# Patient Record
Sex: Male | Born: 1975 | Race: Black or African American | Hispanic: No | Marital: Single | Smoking: Current every day smoker
Health system: Southern US, Community
[De-identification: ages and names within clinical notes are randomized; demographics above are authoritative.]

## PROBLEM LIST (undated history)

## (undated) DIAGNOSIS — F259 Schizoaffective disorder, unspecified: Secondary | ICD-10-CM

## (undated) DIAGNOSIS — F250 Schizoaffective disorder, bipolar type: Secondary | ICD-10-CM

## (undated) HISTORY — PX: SMALL INTESTINE SURGERY: SHX150

---

## 2004-09-25 ENCOUNTER — Emergency Department: Payer: Self-pay | Admitting: Emergency Medicine

## 2005-07-08 ENCOUNTER — Emergency Department: Payer: Self-pay | Admitting: Emergency Medicine

## 2005-08-14 ENCOUNTER — Emergency Department: Payer: Self-pay | Admitting: Emergency Medicine

## 2005-08-25 ENCOUNTER — Emergency Department: Payer: Self-pay | Admitting: General Practice

## 2005-09-01 ENCOUNTER — Emergency Department: Payer: Self-pay | Admitting: Unknown Physician Specialty

## 2007-10-13 ENCOUNTER — Emergency Department: Payer: Self-pay | Admitting: Emergency Medicine

## 2007-12-12 ENCOUNTER — Emergency Department: Payer: Self-pay | Admitting: Emergency Medicine

## 2009-08-25 ENCOUNTER — Emergency Department: Payer: Self-pay | Admitting: Emergency Medicine

## 2009-11-14 ENCOUNTER — Emergency Department: Payer: Self-pay | Admitting: Emergency Medicine

## 2010-05-29 ENCOUNTER — Emergency Department: Payer: Self-pay | Admitting: Unknown Physician Specialty

## 2011-10-14 ENCOUNTER — Inpatient Hospital Stay: Payer: Self-pay | Admitting: Surgery

## 2011-10-14 LAB — COMPREHENSIVE METABOLIC PANEL WITH GFR
Albumin: 4.2 g/dL
Alkaline Phosphatase: 118 U/L
Anion Gap: 11
BUN: 10 mg/dL
Bilirubin,Total: 0.8 mg/dL
Calcium, Total: 8.9 mg/dL
Chloride: 104 mmol/L
Co2: 24 mmol/L
Creatinine: 1.28 mg/dL
EGFR (African American): >60
EGFR (Non-African Amer.): >60
Glucose: 84 mg/dL
Osmolality: 276
Potassium: 4 mmol/L
SGOT(AST): 43 U/L — ABNORMAL HIGH
SGPT (ALT): 39 U/L
Sodium: 139 mmol/L
Total Protein: 8.2 g/dL

## 2011-10-14 LAB — URINALYSIS, COMPLETE
Bacteria: NONE SEEN
Bilirubin,UR: NEGATIVE
Glucose,UR: NEGATIVE mg/dL
Leukocyte Esterase: NEGATIVE
Nitrite: NEGATIVE
Ph: 5
Protein: NEGATIVE
RBC,UR: 2 /HPF
Specific Gravity: 1.016
Squamous Epithelial: <1
WBC UR: <1 /HPF

## 2011-10-14 LAB — APTT: Activated PTT: 28.3 s

## 2011-10-14 LAB — DRUG SCREEN, URINE
Amphetamines, Ur Screen: NEGATIVE
Barbiturates, Ur Screen: NEGATIVE
Benzodiazepine, Ur Scrn: NEGATIVE
Cannabinoid 50 Ng, Ur ~~LOC~~: POSITIVE
Cocaine Metabolite,Ur ~~LOC~~: NEGATIVE
MDMA (Ecstasy)Ur Screen: NEGATIVE
Methadone, Ur Screen: NEGATIVE
Opiate, Ur Screen: NEGATIVE
Phencyclidine (PCP) Ur S: NEGATIVE
Tricyclic, Ur Screen: NEGATIVE

## 2011-10-14 LAB — PROTIME-INR
INR: 1
Prothrombin Time: 13.5 s

## 2011-10-14 LAB — CBC
HCT: 48.7 %
HGB: 16.6 g/dL
MCH: 34.1 pg — ABNORMAL HIGH
MCHC: 34 g/dL
MCV: 100 fL
Platelet: 213 x10 3/mm 3
RBC: 4.86 x10 6/mm 3
RDW: 15.8 % — ABNORMAL HIGH
WBC: 15.8 x10 3/mm 3 — ABNORMAL HIGH

## 2011-10-14 LAB — MAGNESIUM: Magnesium: 1.7 mg/dL — ABNORMAL LOW

## 2011-10-15 LAB — CBC WITH DIFFERENTIAL/PLATELET
Basophil %: 0.3 %
Eosinophil #: 0 10*3/uL (ref 0.0–0.7)
HCT: 40.7 % (ref 40.0–52.0)
HGB: 13.8 g/dL (ref 13.0–18.0)
Lymphocyte #: 1.4 10*3/uL (ref 1.0–3.6)
MCH: 34.3 pg — ABNORMAL HIGH (ref 26.0–34.0)
MCHC: 34 g/dL (ref 32.0–36.0)
Monocyte #: 0.8 x10 3/mm (ref 0.2–1.0)
Neutrophil #: 5.5 10*3/uL (ref 1.4–6.5)
RBC: 4.04 10*6/uL — ABNORMAL LOW (ref 4.40–5.90)

## 2011-10-15 LAB — COMPREHENSIVE METABOLIC PANEL
Albumin: 2.8 g/dL — ABNORMAL LOW (ref 3.4–5.0)
Alkaline Phosphatase: 91 U/L (ref 50–136)
Anion Gap: 6 — ABNORMAL LOW (ref 7–16)
BUN: 6 mg/dL — ABNORMAL LOW (ref 7–18)
Bilirubin,Total: 1 mg/dL (ref 0.2–1.0)
Chloride: 105 mmol/L (ref 98–107)
Creatinine: 1.04 mg/dL (ref 0.60–1.30)
EGFR (Non-African Amer.): >60
Glucose: 115 mg/dL — ABNORMAL HIGH (ref 65–99)
Osmolality: 274 (ref 275–301)
Potassium: 3.5 mmol/L (ref 3.5–5.1)
SGOT(AST): 36 U/L (ref 15–37)

## 2011-10-22 ENCOUNTER — Emergency Department: Payer: Self-pay | Admitting: Emergency Medicine

## 2011-10-22 LAB — URINALYSIS, COMPLETE
Bacteria: NONE SEEN
Bilirubin,UR: NEGATIVE
Blood: NEGATIVE
Ketone: NEGATIVE
Nitrite: NEGATIVE
Ph: 7 (ref 4.5–8.0)
Squamous Epithelial: <1

## 2011-10-22 LAB — LIPASE, BLOOD: Lipase: 117 U/L (ref 73–393)

## 2011-10-22 LAB — COMPREHENSIVE METABOLIC PANEL
Alkaline Phosphatase: 115 U/L (ref 50–136)
BUN: 8 mg/dL (ref 7–18)
Bilirubin,Total: 0.4 mg/dL (ref 0.2–1.0)
Creatinine: 1.09 mg/dL (ref 0.60–1.30)
EGFR (African American): >60
EGFR (Non-African Amer.): >60
Glucose: 97 mg/dL (ref 65–99)
SGOT(AST): 29 U/L (ref 15–37)
SGPT (ALT): 40 U/L

## 2011-10-22 LAB — CBC
HCT: 49.3 % (ref 40.0–52.0)
MCH: 33.5 pg (ref 26.0–34.0)
MCV: 102 fL — ABNORMAL HIGH (ref 80–100)
RBC: 4.83 10*6/uL (ref 4.40–5.90)
WBC: 7.8 10*3/uL (ref 3.8–10.6)

## 2011-10-22 LAB — PROTIME-INR: INR: 0.9

## 2012-05-04 ENCOUNTER — Emergency Department: Payer: Self-pay | Admitting: Emergency Medicine

## 2013-03-14 LAB — CBC
HCT: 49.3 % (ref 40.0–52.0)
HGB: 16.7 g/dL (ref 13.0–18.0)
MCH: 33.6 pg (ref 26.0–34.0)
MCV: 100 fL (ref 80–100)
Platelet: 211 10*3/uL (ref 150–440)
RBC: 4.96 10*6/uL (ref 4.40–5.90)
WBC: 7 10*3/uL (ref 3.8–10.6)

## 2013-03-14 LAB — COMPREHENSIVE METABOLIC PANEL
Anion Gap: 5 — ABNORMAL LOW (ref 7–16)
BUN: 5 mg/dL — ABNORMAL LOW (ref 7–18)
Bilirubin,Total: 0.5 mg/dL (ref 0.2–1.0)
Chloride: 103 mmol/L (ref 98–107)
EGFR (African American): >60
Glucose: 102 mg/dL — ABNORMAL HIGH (ref 65–99)
Osmolality: 275 (ref 275–301)
SGPT (ALT): 63 U/L (ref 12–78)
Sodium: 139 mmol/L (ref 136–145)
Total Protein: 7.5 g/dL (ref 6.4–8.2)

## 2013-03-14 LAB — URINALYSIS, COMPLETE
Bilirubin,UR: NEGATIVE
Glucose,UR: NEGATIVE mg/dL (ref 0–75)
Leukocyte Esterase: NEGATIVE
Nitrite: NEGATIVE
Ph: 6 (ref 4.5–8.0)
Protein: NEGATIVE
Specific Gravity: 1.005 (ref 1.003–1.030)
Squamous Epithelial: NONE SEEN

## 2013-03-14 LAB — DRUG SCREEN, URINE
Cannabinoid 50 Ng, Ur ~~LOC~~: POSITIVE (ref ?–50)
Cocaine Metabolite,Ur ~~LOC~~: NEGATIVE (ref ?–300)
MDMA (Ecstasy)Ur Screen: NEGATIVE (ref ?–500)
Opiate, Ur Screen: NEGATIVE (ref ?–300)
Tricyclic, Ur Screen: NEGATIVE (ref ?–1000)

## 2013-03-14 LAB — ETHANOL: Ethanol: 119 mg/dL

## 2013-03-16 ENCOUNTER — Inpatient Hospital Stay: Payer: Self-pay | Admitting: Psychiatry

## 2013-03-18 LAB — VALPROIC ACID LEVEL: Valproic Acid: 81 ug/mL

## 2013-11-04 ENCOUNTER — Emergency Department: Payer: Self-pay | Admitting: Emergency Medicine

## 2014-07-08 ENCOUNTER — Emergency Department
Admit: 2014-07-08 | Disposition: A | Discharge status: Other Acute Inpatient Hospital | Payer: Self-pay | Admitting: Student

## 2014-07-08 LAB — CBC
HCT: 50.2 % (ref 40.0–52.0)
HGB: 17.6 g/dL (ref 13.0–18.0)
MCH: 34.3 pg — ABNORMAL HIGH (ref 26.0–34.0)
MCHC: 35.1 g/dL (ref 32.0–36.0)
MCV: 98 fL (ref 80–100)
Platelet: 204 10*3/uL (ref 150–440)
RBC: 5.13 10*6/uL (ref 4.40–5.90)
RDW: 13.7 % (ref 11.5–14.5)
WBC: 5.8 10*3/uL (ref 3.8–10.6)

## 2014-07-08 LAB — COMPREHENSIVE METABOLIC PANEL
ALK PHOS: 105 U/L
Albumin: 4.4 g/dL
Anion Gap: 11 (ref 7–16)
BILIRUBIN TOTAL: 0.6 mg/dL
BUN: 5 mg/dL — ABNORMAL LOW
CHLORIDE: 100 mmol/L — AB
CREATININE: 1 mg/dL
Calcium, Total: 8.9 mg/dL
Co2: 25 mmol/L
EGFR (Non-African Amer.): >60
GLUCOSE: 108 mg/dL — AB
POTASSIUM: 3.4 mmol/L — AB
SGOT(AST): 79 U/L — ABNORMAL HIGH
SGPT (ALT): 40 U/L
Sodium: 136 mmol/L
Total Protein: 7.9 g/dL

## 2014-07-08 LAB — ETHANOL: Ethanol: 185 mg/dL

## 2014-07-08 LAB — DRUG SCREEN, URINE
Amphetamines, Ur Screen: NEGATIVE
BARBITURATES, UR SCREEN: NEGATIVE
Benzodiazepine, Ur Scrn: NEGATIVE
COCAINE METABOLITE, UR ~~LOC~~: NEGATIVE
Cannabinoid 50 Ng, Ur ~~LOC~~: NEGATIVE
MDMA (Ecstasy)Ur Screen: NEGATIVE
Methadone, Ur Screen: NEGATIVE
OPIATE, UR SCREEN: NEGATIVE
Phencyclidine (PCP) Ur S: NEGATIVE
Tricyclic, Ur Screen: NEGATIVE

## 2014-07-08 LAB — TSH: THYROID STIMULATING HORM: 0.721 u[IU]/mL

## 2014-07-08 LAB — URINALYSIS, COMPLETE
BILIRUBIN, UR: NEGATIVE
Bacteria: NONE SEEN
Glucose,UR: NEGATIVE mg/dL (ref 0–75)
Ketone: NEGATIVE
LEUKOCYTE ESTERASE: NEGATIVE
Nitrite: NEGATIVE
PH: 6 (ref 4.5–8.0)
PROTEIN: NEGATIVE
RBC,UR: NONE SEEN /HPF (ref 0–5)
Specific Gravity: 1.001 (ref 1.003–1.030)
Squamous Epithelial: <1
WBC UR: NONE SEEN /HPF (ref 0–5)

## 2014-07-08 LAB — ACETAMINOPHEN LEVEL

## 2014-07-08 LAB — SALICYLATE LEVEL: Salicylates, Serum: <4 mg/dL

## 2014-07-22 LAB — COMPREHENSIVE METABOLIC PANEL
ALT: 75 U/L — AB
ANION GAP: 10 (ref 7–16)
Albumin: 4.5 g/dL
Alkaline Phosphatase: 93 U/L
BUN: 12 mg/dL
Bilirubin,Total: 0.7 mg/dL
CHLORIDE: 98 mmol/L — AB
CREATININE: 1.18 mg/dL
Calcium, Total: 8.8 mg/dL — ABNORMAL LOW
Co2: 27 mmol/L
EGFR (African American): >60
EGFR (Non-African Amer.): >60
Glucose: 111 mg/dL — ABNORMAL HIGH
Potassium: 3.7 mmol/L
SGOT(AST): 57 U/L — ABNORMAL HIGH
Sodium: 135 mmol/L
TOTAL PROTEIN: 7.7 g/dL

## 2014-07-22 LAB — URINALYSIS, COMPLETE
BACTERIA: NONE SEEN
BILIRUBIN, UR: NEGATIVE
GLUCOSE, UR: NEGATIVE mg/dL (ref 0–75)
KETONE: NEGATIVE
LEUKOCYTE ESTERASE: NEGATIVE
Nitrite: NEGATIVE
Ph: 6 (ref 4.5–8.0)
Protein: NEGATIVE
SQUAMOUS EPITHELIAL: NONE SEEN
Specific Gravity: 1.003 (ref 1.003–1.030)
WBC UR: NONE SEEN /HPF (ref 0–5)

## 2014-07-22 LAB — ETHANOL: Ethanol: 157 mg/dL

## 2014-07-22 LAB — DRUG SCREEN, URINE
AMPHETAMINES, UR SCREEN: NEGATIVE
BENZODIAZEPINE, UR SCRN: NEGATIVE
Barbiturates, Ur Screen: NEGATIVE
CANNABINOID 50 NG, UR ~~LOC~~: NEGATIVE
COCAINE METABOLITE, UR ~~LOC~~: NEGATIVE
MDMA (Ecstasy)Ur Screen: NEGATIVE
Methadone, Ur Screen: NEGATIVE
Opiate, Ur Screen: NEGATIVE
Phencyclidine (PCP) Ur S: NEGATIVE
TRICYCLIC, UR SCREEN: NEGATIVE

## 2014-07-22 LAB — CBC
HCT: 51.1 % (ref 40.0–52.0)
HGB: 17.3 g/dL (ref 13.0–18.0)
MCH: 32.7 pg (ref 26.0–34.0)
MCHC: 33.8 g/dL (ref 32.0–36.0)
MCV: 97 fL (ref 80–100)
Platelet: 242 10*3/uL (ref 150–440)
RBC: 5.29 10*6/uL (ref 4.40–5.90)
RDW: 13.6 % (ref 11.5–14.5)
WBC: 8 10*3/uL (ref 3.8–10.6)

## 2014-07-22 LAB — SALICYLATE LEVEL: Salicylates, Serum: <4 mg/dL

## 2014-07-22 LAB — ACETAMINOPHEN LEVEL: Acetaminophen: <10 ug/mL

## 2014-07-24 ENCOUNTER — Inpatient Hospital Stay
Admit: 2014-07-24 | Disposition: A | Discharge status: Home / Self Care | Payer: Self-pay | Attending: Psychiatry | Admitting: Psychiatry

## 2014-07-25 NOTE — H&P (Signed)
PATIENT NAME:  Antonio Mccoy, Antonio Mccoy MR#:  [id number] DATE OF BIRTH:  02-01-1976  DATE OF ADMISSION:  03/16/2013  REFERRING PHYSICIAN: Emergency Room M.D.   ATTENDING PHYSICIAN: Muriel Hannold B. Jennet Maduro, M.D.   IDENTIFYING DATA: Antonio Mccoy is a 39 year old male with history of psychosis.   CHIEF COMPLAINT: "I want to go home."   HISTORY OF PRESENT ILLNESS: Antonio Mccoy was brought to the Emergency Room by his sister who has been taking care of him most of his life. He started behaving erratically lately, knocking on neighbors doors and arguing with his neighbors. He also has not been able to sleep lately. The patient lives with his girlfriend and her 2 children, one of which is a baby. They are not his biological children he states, but he takes good care of them. The patient apparently has not been seeing a psychiatrist lately and has been off medications. He is unable to provide any details of his story and feels that he could go home right away, but does agree to take medications in the hospital including injectable antipsychotic in order to get out of here. His behavior in the Emergency Room has been good and he accepted the first dose of [place]. The patient has a history of mental illness and in the past did well on a combination of Depakote and Haldol Decanoate shots. We know that he also had been treated with Risperdal Consta. The patient has a long history of mental illness. When  psychotic, he used to get in trouble a lot and has multiple arrests. Review of his pharmacy records, discloses that the patient was treated for the last time in August 2012. It is unclear if he has seen anybody since.   PAST PSYCHIATRIC HISTORY: The patient has multiple psychiatric hospitalizations including state hospital admissions.  Apparently the last time when brought to the Emergency Room, he was sent to [facility]. He has been tried on multiple medications. He does well on injectable as compliance  has always been problematic. There were no suicide attempts according to the patient and his sister.   FAMILY PSYCHIATRIC HISTORY: None reported.   PAST MEDICAL HISTORY: Constipation.   ALLERGIES: No known drug allergies.   MEDICATIONS ON ADMISSION: None.   SOCIAL HISTORY: As above. He lives with his girlfriend. Apparently most of his life, he has had a girlfriend and oftentimes has been supported by women.  He is disabled from mental illness and receives Medicare. I am not sure about his Medicaid status.  He has a sister, Venia Minks, phone number [phone] who has been very supportive and making sure that the patient receives proper care.    REVIEW OF SYSTEMS:  CONSTITUTIONAL: No fevers or chills. No weight changes.  EYES: No double or blurred vision.  ENT: No hearing loss.  RESPIRATORY: No shortness of breath or cough.  CARDIOVASCULAR: No chest pain or orthopnea.  GASTROINTESTINAL: No abdominal pain, nausea, vomiting, or diarrhea.  GENITOURINARY: No incontinence or frequency.  ENDOCRINE: No heat or cold intolerance.  LYMPHATIC: No anemia or easy bruising.  INTEGUMENTARY: No acne or rash.  MUSCULOSKELETAL: No muscle or joint pain.  NEUROLOGIC: No tingling or weakness.  PSYCHIATRIC: See history of present illness for details.   PHYSICAL EXAMINATION: VITAL SIGNS: Blood pressure 134/77, pulse 96, respirations 20, temperature 99.  GENERAL: This is a well-developed male in no acute distress.  HEENT: The pupils are equal, round, and reactive to light. Sclerae are anicteric.  NECK: Supple. No thyromegaly.  LUNGS: Clear to auscultation. No dullness to percussion.  HEART: Regular rhythm and rate. No murmurs, rubs, or gallops.  ABDOMEN: Soft, nontender, nondistended. Positive bowel sounds.  MUSCULOSKELETAL: Normal muscle strength in all extremities.  SKIN: No rashes or bruises.  LYMPHATIC: No cervical adenopathy.  NEUROLOGIC: Cranial nerves II through XII are intact.    LABORATORY DATA: Chemistries within normal limits except for potassium of 3.2. Blood alcohol level is 0.119. LFTs within normal limits except for alkaline phosphatase of 121 and AST of 77. Urine tox screen is positive for cannabinoids. CBC within normal limits. Urinalysis is not suggestive of urinary tract infection. Serum acetaminophen less than 2, serum salicylates 4.7.   MENTAL STATUS EXAMINATION ON ADMISSION: The patient is alert and oriented to person, place, and somewhat to situation. He has been sleeping in the Emergency Room in the morning following injection of the Haldol and Geodon last night when he was slightly agitated. He was unhappy and felt that he was being tricked into coming to the Emergency Room and was mad when he learned that he is on involuntary inpatient psychiatric commitment. At present, his behavior is exemplary and he is compliant with treatment and agrees with the plan. He is well groomed. He wears hospital scrubs. He maintains good eye contact. There is poverty of speech. His mood is fine with flat affect. Thought process is slow. Thought content: He denies thoughts of hurting himself or others. He seems a little paranoid. He denies auditory or visual hallucinations. His cognition is difficult to assess. He is probably disorganized at president. His insight and judgment are poor.   SUICIDE RISK ASSESSMENT ON ADMISSION: This is a patient with history of psychosis who was brought to the hospital with psychotic disorganization in the context of substance use and treatment noncompliance.   DIAGNOSES: AXIS I: Schizophrenia, alcohol abuse, marijuana abuse.  AXIS II: Deferred.  AXIS III: Deferred.  AXIS IV: Mental illness, substance abuse, treatment compliance.  AXIS V: Global assessment of functioning 35.   PLAN: The patient was admitted to [facility] Medicine unit for safety, stabilization and medication management. He was initially placed on  suicide precautions and was closely monitored for any unsafe behaviors. He underwent full psychiatric and risk assessment. He received pharmacotherapy, individual and group psychotherapy, substance abuse counseling, and support from therapeutic milieu.  1.  Psychosis. The patient was started on Invega Sustenna in the Emergency Room and received his first injection of 234 mg. Next injection on the 16th. He was also started on Depakote 500 mg 3 times daily as he responded to Depakote well in the past. It is quite possible that he has schizoaffective disorder or maybe even bipolar since he has been away from the hospital for the past 2 years, while off medications.  2.  Substance abuse: The patient minimizes his problems and is not interested in substance abuse treatment at this point.  3.  Disposition. He will likely return to home with his girlfriend.    ____________________________ Ellin GoodieJolanta B. Jennet MaduroPucilowska, MD jbp:dp D: 03/16/2013 15:36:34 ET T: 03/16/2013 16:29:04 ET JOB#: [id number]  cc: Finlay Godbee B. Jennet MaduroPucilowska, MD, <Dictator> Shari ProwsJOLANTA B Agnes Probert MD ELECTRONICALLY SIGNED 04/02/2013 4:33
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
## 2014-07-28 LAB — VALPROIC ACID LEVEL: VALPROIC ACID: 50 ug/mL (ref 50–100)
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
## 2014-08-03 NOTE — Consult Note (Addendum)
PATIENT NAME:  [name], Idrissa L MR#:  [id number] DATE OF BIRTH:  [birth year]  DATE OF CONSULTATION:  07/22/2014  REFERRING PHYSICIAN:   CONSULTING PHYSICIAN:  Audery AmelJohn T. Clapacs, MD  IDENTIFYING INFORMATION AND REASON FOR CONSULT: This is a 39 year old man with a history of schizophrenia, who is brought into the hospital under commitment filed by his sister that describes disorganized, psychotic behavior.   CHIEF COMPLAINT: "There is nothing wrong with me."   HISTORY OF PRESENT ILLNESS: Information from the patient and the chart. Commitment paperwork from his sister says that ever since coming home from [facility] a week and a half ago the patient has continued to be psychotic. She describes him standing out in the middle of the road making traffic drive around him. Feels like he is not taking any medication and is continuing to drink. When he first came into the Emergency Room today the patient was very agitated and unable to give any history. He was given multiple doses of medication and has slept for a while. On interview today he knows he is in the hospital, but says there is no reason for him to be here. He claims he has been taking his medication and that everything is under control. Denies any hallucinations. Does not think any of his behavior has been a problem.   PAST PSYCHIATRIC HISTORY: Long history of schizophrenia and multiple hospitalizations. No known history of suicide attempts. Gets quite agitated at times when he is psychotic. Has responded well in the past to Haldol and has been on Haldol decanoate as well as Depakote.   PAST MEDICAL HISTORY: No known significant ongoing medical problems.   SUBSTANCE ABUSE HISTORY: The patient drinks regularly which he admits to, but will not be specific about how much. Denies that he abuses other drugs.   FAMILY HISTORY: No known family history.   SOCIAL HISTORY: The patient lives with some extended family in the community. Not  working. It sounds like he often is a nuisance when he is psychotic.   REVIEW OF SYSTEMS: The patient denies any pain. Does say he feels a little tired. Denies hallucinations. Denies suicidal or homicidal ideation.   MENTAL STATUS EXAMINATION: Somewhat disheveled gentleman who looks his stated age. Not very cooperative with the interview. Eye contact poor. Psychomotor activity slow and sluggish. Speech is decreased in total amount, quiet, and at times hard to understand. Affect flat. Mood stated as fine. Thoughts are disorganized, scattered, paranoid in content. Denies auditory or visual hallucinations. Denies suicidal or homicidal ideation. Repeats 3 words immediately, remembers 1 of them at 3 minutes. Judgment and insight impaired.   LABORATORY RESULTS: Alcohol level on presentation last night 157. AST elevated at 57, ALT elevated at 75. Calcium low at 8.8. Drug screen done today was all negative. Urinalysis, no sign of infection. CBC unremarkable.   VITAL SIGNS: Blood pressure 122/89, respirations 20, pulse 113.   ASSESSMENT: This is a 39 year old man with schizophrenia and alcohol abuse. He was hospitalized just recently, but it sounds like he has not been on medication since coming home and it is unclear who he was supposed to follow up with. The patient's insight and judgment are poor. He was extremely agitated and confused this morning. Since getting some medicine he has calmed down, but his insight is still poor, and he is still confused and psychotic. Needs hospitalization for stabilization. Right now he may still be a little too unstable for management on the inpatient psychiatry ward.  TREATMENT PLAN: The patient will be started back on Haldol oral 5 mg twice a day, also Depakote 1000 mg at night. Reevaluate tomorrow. If he seems to be stable on his feet and not acutely hostile we will consider admitting him downstairs at that time. The case discussed with Emergency Room staff and psychiatry  team.   DIAGNOSIS PRINCIPAL AND PRIMARY:   AXIS I: Schizophrenia.   SECONDARY DIAGNOSES:   AXIS I: Alcohol abuse, moderate.   AXIS II: Deferred.   AXIS III: No diagnosis.     ____________________________ Audery Amel, MD jtc:bu D: 07/22/2014 19:15:39 ET T: 07/22/2014 19:29:42 ET JOB#: [id number]  cc: Audery Amel, MD, <Dictator> Audery Amel MD ELECTRONICALLY SIGNED 08/08/2014 19:30

## 2014-08-03 NOTE — Consult Note (Signed)
PAtient is calmer today but continues to appear very confused and slow. Not hostile or threatening.  SI. Admits to still having AH. Affect flat. Thoughts slow. Poor insight now to inpatient psych ward. Continue current medication. Precautions in place. schizophrenia  Electronic Signatures: Clapacs, Jackquline DenmarkJohn T (MD)  (Signed on 21-Apr-16 21:21)  Authored  Last Updated: 21-Apr-16 21:21 by Audery Amellapacs, John T (MD)

## 2014-08-03 NOTE — Consult Note (Addendum)
PATIENT NAME:  [name], Okie L MR#:  [id number] DATE OF BIRTH:  20-Mar-1976  DATE OF CONSULTATION:  07/23/2014  REFERRING PHYSICIAN:   CONSULTING PHYSICIAN:  Audery AmelJohn T. Clapacs, MD  HISTORY OF PRESENT ILLNESS: See yesterday's note. [name] is a young man with schizophrenia with chronic noncompliance, who has been acting bizarre in his home community and putting himself in danger. Here in the Emergency Room, he was very disorganized and psychotic, agitated, but fortunately, has been compliant with medication. He has been treated with Haldol and Depakote, which had been affective medicines for him in the past. Because of some level of agitation, he has been given higher doses on a couple of occasions, including being given 20 mg of haloperidol stat, this morning. Fortunately, he has tolerated all of that and there is no sign that he is having a dystonic reaction or having akathisia. He was agitated this morning, but is much calmer and more cooperative today.   REVIEW OF SYSTEMS: Denies any physical symptoms, other than fatigue. No GI, cardiac, pulmonary symptoms, no malaise, no pain.   MENTAL STATUS EXAMINATION: Disheveled gentleman who looks his stated age, who is only passively cooperative. Intermittent eye contact. Psychomotor activity: Slow and sluggish. Speech is slow and a little bit slurred. Affect: Flat. Mood stated as fine. Thoughts: Very slow and concrete. Denies suicidal or homicidal ideation. Unable to formulate reasonable plans for himself. Oriented to the basic situation.   ASSESSMENT: [name] has schizophrenia and really needs to have, probably, a longer period of stabilization and optimally, to be put on long-acting injectable medicine. At this point, he seems has calmed down enough that we can accommodate admitting him to the psychiatry ward. Orders done, and he will be admitted to psychiatry downstairs.   DIAGNOSIS, PRINCIPAL AND PRIMARY:  AXIS I: Schizophrenia.      ____________________________ Audery AmelJohn T. Clapacs, MD jtc:mw D: 07/23/2014 15:17:02 ET T: 07/23/2014 15:41:53 ET JOB#: [id number]  cc: Audery AmelJohn T. Clapacs, MD, <Dictator> Audery AmelJOHN T CLAPACS MD ELECTRONICALLY SIGNED 08/08/2014 19:30
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
## 2015-02-02 ENCOUNTER — Encounter
Admission: EM | Disposition: A | Discharge status: Home Health | Payer: Self-pay | Source: Home / Self Care | Attending: Surgery

## 2015-02-02 ENCOUNTER — Emergency Department: Payer: Self-pay

## 2015-02-02 ENCOUNTER — Encounter: Payer: Self-pay | Admitting: Emergency Medicine

## 2015-02-02 ENCOUNTER — Inpatient Hospital Stay: Payer: Self-pay

## 2015-02-02 ENCOUNTER — Inpatient Hospital Stay
Admission: EM | Admit: 2015-02-02 | Discharge: 2015-02-03 | DRG: 494 | Disposition: A | Discharge status: Home Health | Payer: Self-pay | Attending: Surgery | Admitting: Surgery

## 2015-02-02 ENCOUNTER — Inpatient Hospital Stay: Payer: MEDICAID | Admitting: Certified Registered Nurse Anesthetist

## 2015-02-02 ENCOUNTER — Inpatient Hospital Stay: Payer: MEDICAID

## 2015-02-02 ENCOUNTER — Inpatient Hospital Stay: Payer: Self-pay | Admitting: Certified Registered Nurse Anesthetist

## 2015-02-02 DIAGNOSIS — F10920 Alcohol use, unspecified with intoxication, uncomplicated: Secondary | ICD-10-CM

## 2015-02-02 DIAGNOSIS — Z79899 Other long term (current) drug therapy: Secondary | ICD-10-CM

## 2015-02-02 DIAGNOSIS — Y92480 Sidewalk as the place of occurrence of the external cause: Secondary | ICD-10-CM

## 2015-02-02 DIAGNOSIS — S82832A Other fracture of upper and lower end of left fibula, initial encounter for closed fracture: Secondary | ICD-10-CM | POA: Diagnosis present

## 2015-02-02 DIAGNOSIS — F17210 Nicotine dependence, cigarettes, uncomplicated: Secondary | ICD-10-CM | POA: Diagnosis present

## 2015-02-02 DIAGNOSIS — S82892B Other fracture of left lower leg, initial encounter for open fracture type I or II: Principal | ICD-10-CM | POA: Diagnosis present

## 2015-02-02 DIAGNOSIS — F10129 Alcohol abuse with intoxication, unspecified: Secondary | ICD-10-CM | POA: Diagnosis present

## 2015-02-02 DIAGNOSIS — S82899A Other fracture of unspecified lower leg, initial encounter for closed fracture: Secondary | ICD-10-CM | POA: Diagnosis present

## 2015-02-02 DIAGNOSIS — F209 Schizophrenia, unspecified: Secondary | ICD-10-CM | POA: Diagnosis present

## 2015-02-02 DIAGNOSIS — T148XXA Other injury of unspecified body region, initial encounter: Secondary | ICD-10-CM

## 2015-02-02 DIAGNOSIS — Z9114 Patient's other noncompliance with medication regimen: Secondary | ICD-10-CM

## 2015-02-02 HISTORY — PX: ORIF ANKLE FRACTURE: SHX5408

## 2015-02-02 LAB — ETHANOL: Alcohol, Ethyl (B): 186 mg/dL — ABNORMAL HIGH (ref <5)

## 2015-02-02 LAB — CBC WITH DIFFERENTIAL/PLATELET
Basophils Absolute: 0.1 10*3/uL (ref 0–0.1)
Basophils Relative: 1 %
EOS PCT: 1 %
Eosinophils Absolute: 0.1 10*3/uL (ref 0–0.7)
HEMATOCRIT: 48.4 % (ref 40.0–52.0)
Hemoglobin: 16.9 g/dL (ref 13.0–18.0)
LYMPHS ABS: 3.4 10*3/uL (ref 1.0–3.6)
LYMPHS PCT: 44 %
MCH: 34 pg (ref 26.0–34.0)
MCHC: 34.9 g/dL (ref 32.0–36.0)
MCV: 97.5 fL (ref 80.0–100.0)
Monocytes Absolute: 0.7 10*3/uL (ref 0.2–1.0)
Monocytes Relative: 9 %
Neutro Abs: 3.5 10*3/uL (ref 1.4–6.5)
Neutrophils Relative %: 45 %
Platelets: 227 10*3/uL (ref 150–440)
RBC: 4.97 MIL/uL (ref 4.40–5.90)
RDW: 14.1 % (ref 11.5–14.5)
WBC: 7.8 10*3/uL (ref 3.8–10.6)

## 2015-02-02 LAB — COMPREHENSIVE METABOLIC PANEL
ALBUMIN: 4.4 g/dL (ref 3.5–5.0)
ALK PHOS: 92 U/L (ref 38–126)
ALT: 32 U/L (ref 17–63)
AST: 38 U/L (ref 15–41)
Anion gap: 15 (ref 5–15)
BUN: 10 mg/dL (ref 6–20)
CO2: 23 mmol/L (ref 22–32)
Calcium: 9 mg/dL (ref 8.9–10.3)
Chloride: 101 mmol/L (ref 101–111)
Creatinine, Ser: 1.14 mg/dL (ref 0.61–1.24)
GFR calc Af Amer: >60 mL/min (ref >60)
GFR calc non Af Amer: >60 mL/min (ref >60)
GLUCOSE: 100 mg/dL — AB (ref 65–99)
POTASSIUM: 3 mmol/L — AB (ref 3.5–5.1)
SODIUM: 139 mmol/L (ref 135–145)
Total Bilirubin: 0.5 mg/dL (ref 0.3–1.2)
Total Protein: 7.5 g/dL (ref 6.5–8.1)

## 2015-02-02 LAB — SURGICAL PCR SCREEN
MRSA, PCR: NEGATIVE
STAPHYLOCOCCUS AUREUS: NEGATIVE

## 2015-02-02 SURGERY — OPEN REDUCTION INTERNAL FIXATION (ORIF) ANKLE FRACTURE
Anesthesia: General | Laterality: Left

## 2015-02-02 MED ORDER — MORPHINE SULFATE (PF) 4 MG/ML IV SOLN
4.0000 mg | Freq: Once | INTRAVENOUS | Status: AC
  Administered 2015-02-02: 4 mg via INTRAVENOUS
  Filled 2015-02-02: qty 1

## 2015-02-02 MED ORDER — KCL IN DEXTROSE-NACL 20-5-0.9 MEQ/L-%-% IV SOLN
INTRAVENOUS | Status: DC
  Administered 2015-02-02 – 2015-02-03 (×2): via INTRAVENOUS
  Filled 2015-02-02 (×4): qty 1000

## 2015-02-02 MED ORDER — KETOROLAC TROMETHAMINE 15 MG/ML IJ SOLN
15.0000 mg | Freq: Four times a day (QID) | INTRAMUSCULAR | Status: DC
  Administered 2015-02-02 – 2015-02-03 (×2): 15 mg via INTRAVENOUS
  Filled 2015-02-02 (×2): qty 1

## 2015-02-02 MED ORDER — LABETALOL HCL 5 MG/ML IV SOLN
10.0000 mg | Freq: Once | INTRAVENOUS | Status: AC
  Administered 2015-02-02: 5 mg via INTRAVENOUS

## 2015-02-02 MED ORDER — BUPIVACAINE HCL (PF) 0.5 % IJ SOLN
INTRAMUSCULAR | Status: AC
  Filled 2015-02-02: qty 30

## 2015-02-02 MED ORDER — LABETALOL HCL 5 MG/ML IV SOLN
10.0000 mg | Freq: Once | INTRAVENOUS | Status: AC
  Administered 2015-02-02: 10 mg via INTRAVENOUS

## 2015-02-02 MED ORDER — FENTANYL CITRATE (PF) 100 MCG/2ML IJ SOLN: 25.0000 ug | INTRAMUSCULAR | Status: DC | PRN

## 2015-02-02 MED ORDER — FENTANYL CITRATE (PF) 100 MCG/2ML IJ SOLN
INTRAMUSCULAR | Status: DC | PRN
  Administered 2015-02-02 (×2): 50 ug via INTRAVENOUS
  Administered 2015-02-02: 25 ug via INTRAVENOUS
  Administered 2015-02-02 (×2): 50 ug via INTRAVENOUS
  Administered 2015-02-02: 25 ug via INTRAVENOUS

## 2015-02-02 MED ORDER — MAGNESIUM HYDROXIDE 400 MG/5ML PO SUSP: 30.0000 mL | Freq: Every day | ORAL | Status: DC | PRN

## 2015-02-02 MED ORDER — ONDANSETRON HCL 4 MG PO TABS: 4.0000 mg | ORAL_TABLET | Freq: Four times a day (QID) | ORAL | Status: DC | PRN

## 2015-02-02 MED ORDER — OXYCODONE HCL 5 MG PO TABS
5.0000 mg | ORAL_TABLET | Freq: Once | ORAL | Status: DC | PRN
  Filled 2015-02-02: qty 1

## 2015-02-02 MED ORDER — PANTOPRAZOLE SODIUM 40 MG IV SOLR
40.0000 mg | Freq: Every day | INTRAVENOUS | Status: DC
  Administered 2015-02-02: 40 mg via INTRAVENOUS
  Filled 2015-02-02: qty 40

## 2015-02-02 MED ORDER — HYDROMORPHONE HCL 1 MG/ML IJ SOLN
1.0000 mg | INTRAMUSCULAR | Status: DC | PRN
  Administered 2015-02-02 – 2015-02-03 (×4): 2 mg via INTRAVENOUS
  Filled 2015-02-02 (×4): qty 2

## 2015-02-02 MED ORDER — KCL IN DEXTROSE-NACL 20-5-0.9 MEQ/L-%-% IV SOLN
INTRAVENOUS | Status: DC
  Administered 2015-02-02: 05:00:00 via INTRAVENOUS
  Filled 2015-02-02 (×4): qty 1000

## 2015-02-02 MED ORDER — ONDANSETRON HCL 4 MG/2ML IJ SOLN
INTRAMUSCULAR | Status: DC | PRN
  Administered 2015-02-02: 4 mg via INTRAVENOUS

## 2015-02-02 MED ORDER — ONDANSETRON HCL 4 MG/2ML IJ SOLN
4.0000 mg | Freq: Once | INTRAMUSCULAR | Status: AC
  Administered 2015-02-02: 4 mg via INTRAVENOUS
  Filled 2015-02-02: qty 2

## 2015-02-02 MED ORDER — KETOROLAC TROMETHAMINE 30 MG/ML IJ SOLN
30.0000 mg | Freq: Once | INTRAMUSCULAR | Status: AC
  Administered 2015-02-03: 30 mg via INTRAVENOUS
  Filled 2015-02-02: qty 1

## 2015-02-02 MED ORDER — ACETAMINOPHEN 325 MG PO TABS: 650.0000 mg | ORAL_TABLET | Freq: Four times a day (QID) | ORAL | Status: DC | PRN

## 2015-02-02 MED ORDER — SODIUM CHLORIDE 0.9 % IV BOLUS (SEPSIS)
1000.0000 mL | Freq: Once | INTRAVENOUS | Status: AC
  Administered 2015-02-02: 1000 mL via INTRAVENOUS

## 2015-02-02 MED ORDER — PROPOFOL 10 MG/ML IV BOLUS
INTRAVENOUS | Status: DC | PRN
  Administered 2015-02-02: 50 mg via INTRAVENOUS
  Administered 2015-02-02: 150 mg via INTRAVENOUS
  Administered 2015-02-02: 50 mg via INTRAVENOUS

## 2015-02-02 MED ORDER — ACETAMINOPHEN 650 MG RE SUPP: 650.0000 mg | Freq: Four times a day (QID) | RECTAL | Status: DC | PRN

## 2015-02-02 MED ORDER — CEFAZOLIN SODIUM-DEXTROSE 2-3 GM-% IV SOLR
2.0000 g | Freq: Three times a day (TID) | INTRAVENOUS | Status: DC
  Administered 2015-02-02: 2 g via INTRAVENOUS
  Filled 2015-02-02 (×4): qty 50

## 2015-02-02 MED ORDER — FLEET ENEMA 7-19 GM/118ML RE ENEM: 1.0000 | ENEMA | Freq: Once | RECTAL | Status: DC | PRN

## 2015-02-02 MED ORDER — DIPHENHYDRAMINE HCL 12.5 MG/5ML PO ELIX: 12.5000 mg | ORAL_SOLUTION | ORAL | Status: DC | PRN

## 2015-02-02 MED ORDER — OXYCODONE HCL 5 MG/5ML PO SOLN: 5.0000 mg | Freq: Once | ORAL | Status: DC | PRN

## 2015-02-02 MED ORDER — DOCUSATE SODIUM 100 MG PO CAPS
100.0000 mg | ORAL_CAPSULE | Freq: Two times a day (BID) | ORAL | Status: DC
  Administered 2015-02-03: 100 mg via ORAL
  Filled 2015-02-02: qty 1

## 2015-02-02 MED ORDER — HYDRALAZINE HCL 20 MG/ML IJ SOLN
10.0000 mg | Freq: Once | INTRAMUSCULAR | Status: AC
  Administered 2015-02-02: 10 mg via INTRAVENOUS

## 2015-02-02 MED ORDER — LACTATED RINGERS IV SOLN
INTRAVENOUS | Status: DC | PRN
  Administered 2015-02-02: 15:00:00 via INTRAVENOUS

## 2015-02-02 MED ORDER — TETANUS-DIPHTH-ACELL PERTUSSIS 5-2.5-18.5 LF-MCG/0.5 IM SUSP
0.5000 mL | Freq: Once | INTRAMUSCULAR | Status: AC
  Administered 2015-02-02: 0.5 mL via INTRAMUSCULAR
  Filled 2015-02-02: qty 0.5

## 2015-02-02 MED ORDER — NEOMYCIN-POLYMYXIN B GU 40-200000 IR SOLN
Status: AC
  Filled 2015-02-02: qty 4

## 2015-02-02 MED ORDER — BISACODYL 10 MG RE SUPP
10.0000 mg | Freq: Every day | RECTAL | Status: DC | PRN
Start: 2015-02-02 — End: 2015-02-03

## 2015-02-02 MED ORDER — LIDOCAINE HCL (CARDIAC) 20 MG/ML IV SOLN
INTRAVENOUS | Status: DC | PRN
  Administered 2015-02-02: 100 mg via INTRAVENOUS

## 2015-02-02 MED ORDER — HYDROMORPHONE HCL 1 MG/ML IJ SOLN
1.5000 mg | INTRAMUSCULAR | Status: DC | PRN
  Administered 2015-02-02 (×3): 1.5 mg via INTRAVENOUS
  Filled 2015-02-02 (×3): qty 2

## 2015-02-02 MED ORDER — HYDROMORPHONE HCL 1 MG/ML IJ SOLN
1.0000 mg | Freq: Once | INTRAMUSCULAR | Status: AC
  Administered 2015-02-02: 1 mg via INTRAVENOUS
  Filled 2015-02-02: qty 1

## 2015-02-02 MED ORDER — LABETALOL HCL 5 MG/ML IV SOLN
INTRAVENOUS | Status: AC
  Administered 2015-02-02: 5 mg via INTRAVENOUS
  Filled 2015-02-02: qty 4

## 2015-02-02 MED ORDER — HYDROMORPHONE HCL 1 MG/ML IJ SOLN
1.0000 mg | INTRAMUSCULAR | Status: DC | PRN
  Administered 2015-02-02 (×3): 2 mg via INTRAVENOUS
  Filled 2015-02-02 (×3): qty 2

## 2015-02-02 MED ORDER — ENOXAPARIN SODIUM 40 MG/0.4ML ~~LOC~~ SOLN: 40.0000 mg | Freq: Two times a day (BID) | SUBCUTANEOUS | Status: DC

## 2015-02-02 MED ORDER — METOCLOPRAMIDE HCL 5 MG/ML IJ SOLN: 5.0000 mg | Freq: Three times a day (TID) | INTRAMUSCULAR | Status: DC | PRN

## 2015-02-02 MED ORDER — ENOXAPARIN SODIUM 30 MG/0.3ML ~~LOC~~ SOLN
30.0000 mg | Freq: Two times a day (BID) | SUBCUTANEOUS | Status: DC
  Administered 2015-02-02 – 2015-02-03 (×2): 30 mg via SUBCUTANEOUS
  Filled 2015-02-02 (×2): qty 0.3

## 2015-02-02 MED ORDER — OXYCODONE HCL 5 MG PO TABS
5.0000 mg | ORAL_TABLET | ORAL | Status: DC | PRN
  Administered 2015-02-02: 5 mg via ORAL
  Administered 2015-02-03 (×3): 10 mg via ORAL
  Filled 2015-02-02 (×3): qty 2

## 2015-02-02 MED ORDER — PROPOFOL 500 MG/50ML IV EMUL
INTRAVENOUS | Status: DC | PRN
  Administered 2015-02-02: 50 ug/kg/min via INTRAVENOUS

## 2015-02-02 MED ORDER — METOCLOPRAMIDE HCL 10 MG PO TABS: 5.0000 mg | ORAL_TABLET | Freq: Three times a day (TID) | ORAL | Status: DC | PRN

## 2015-02-02 MED ORDER — CEFAZOLIN SODIUM-DEXTROSE 2-3 GM-% IV SOLR
2.0000 g | Freq: Four times a day (QID) | INTRAVENOUS | Status: AC
  Administered 2015-02-02 – 2015-02-03 (×3): 2 g via INTRAVENOUS
  Filled 2015-02-02 (×3): qty 50

## 2015-02-02 MED ORDER — CEFAZOLIN SODIUM 1-5 GM-% IV SOLN: 1.0000 g | Freq: Once | INTRAVENOUS | Status: DC

## 2015-02-02 MED ORDER — ONDANSETRON HCL 4 MG/2ML IJ SOLN: 4.0000 mg | Freq: Four times a day (QID) | INTRAMUSCULAR | Status: DC | PRN

## 2015-02-02 MED ORDER — CEFAZOLIN SODIUM-DEXTROSE 2-3 GM-% IV SOLR
2.0000 g | Freq: Once | INTRAVENOUS | Status: AC
  Administered 2015-02-02: 2 g via INTRAVENOUS
  Filled 2015-02-02: qty 50

## 2015-02-02 MED ORDER — NEOMYCIN-POLYMYXIN B GU 40-200000 IR SOLN
Status: DC | PRN
  Administered 2015-02-02: 8 mL

## 2015-02-02 MED ORDER — HYDRALAZINE HCL 20 MG/ML IJ SOLN
INTRAMUSCULAR | Status: AC
  Filled 2015-02-02: qty 1

## 2015-02-02 MED ORDER — MIDAZOLAM HCL 2 MG/2ML IJ SOLN
INTRAMUSCULAR | Status: DC | PRN
  Administered 2015-02-02: 2 mg via INTRAVENOUS

## 2015-02-02 SURGICAL SUPPLY — 51 items
BANDAGE ELASTIC 4 CLIP ST LF (GAUZE/BANDAGES/DRESSINGS) ×6 IMPLANT
BANDAGE ELASTIC 6 CLIP ST LF (GAUZE/BANDAGES/DRESSINGS) ×3 IMPLANT
BIT DRILL 2.5X2.75 QC CALB (BIT) ×6 IMPLANT
BIT DRILL 3.5X5.5 QC CALB (BIT) ×3 IMPLANT
BLADE SURG SZ10 CARB STEEL (BLADE) ×6 IMPLANT
BNDG COHESIVE 4X5 TAN STRL (GAUZE/BANDAGES/DRESSINGS) ×3 IMPLANT
BNDG ESMARK 6X12 TAN STRL LF (GAUZE/BANDAGES/DRESSINGS) ×3 IMPLANT
BNDG PLASTER FAST 4X5 WHT LF (CAST SUPPLIES) ×12 IMPLANT
CANISTER SUCT 1200ML W/VALVE (MISCELLANEOUS) ×3 IMPLANT
CHLORAPREP W/TINT 26ML (MISCELLANEOUS) ×6 IMPLANT
DRAPE C-ARM XRAY 36X54 (DRAPES) ×3 IMPLANT
DRAPE C-ARMOR (DRAPES) ×3 IMPLANT
DRAPE INCISE IOBAN 66X45 STRL (DRAPES) ×3 IMPLANT
DRAPE U-SHAPE 47X51 STRL (DRAPES) ×3 IMPLANT
ELECT CAUTERY BLADE 6.4 (BLADE) ×3 IMPLANT
FIXATION ZIPTIGHT ANKLE SNDSMS (Ankle) ×2 IMPLANT
GAUZE PETRO XEROFOAM 1X8 (MISCELLANEOUS) ×3 IMPLANT
GAUZE SPONGE 4X4 12PLY STRL (GAUZE/BANDAGES/DRESSINGS) ×3 IMPLANT
GLOVE BIO SURGEON STRL SZ8 (GLOVE) ×6 IMPLANT
GLOVE INDICATOR 8.0 STRL GRN (GLOVE) ×3 IMPLANT
GOWN STRL REUS W/ TWL LRG LVL3 (GOWN DISPOSABLE) ×1 IMPLANT
GOWN STRL REUS W/ TWL XL LVL3 (GOWN DISPOSABLE) ×1 IMPLANT
GOWN STRL REUS W/TWL LRG LVL3 (GOWN DISPOSABLE) ×2
GOWN STRL REUS W/TWL XL LVL3 (GOWN DISPOSABLE) ×2
HEMOVAC 400ML (MISCELLANEOUS) ×3
KIT DRAIN HEMOVAC JP 7FR 400ML (MISCELLANEOUS) ×1 IMPLANT
LABEL OR SOLS (LABEL) ×3 IMPLANT
NS IRRIG 1000ML POUR BTL (IV SOLUTION) ×3 IMPLANT
PACK EXTREMITY ARMC (MISCELLANEOUS) ×3 IMPLANT
PAD ABD DERMACEA PRESS 5X9 (GAUZE/BANDAGES/DRESSINGS) ×6 IMPLANT
PAD CAST CTTN 4X4 STRL (SOFTGOODS) ×2 IMPLANT
PAD GROUND ADULT SPLIT (MISCELLANEOUS) ×3 IMPLANT
PAD PREP 24X41 OB/GYN DISP (PERSONAL CARE ITEMS) ×3 IMPLANT
PADDING CAST COTTON 4X4 STRL (SOFTGOODS) ×4
PLATE ACE 100DE 10H (Plate) ×3 IMPLANT
SCREW CORTICAL 3.5MM  16MM (Screw) ×6 IMPLANT
SCREW CORTICAL 3.5MM  20MM (Screw) ×2 IMPLANT
SCREW CORTICAL 3.5MM 16MM (Screw) ×3 IMPLANT
SCREW CORTICAL 3.5MM 18MM (Screw) ×6 IMPLANT
SCREW CORTICAL 3.5MM 20MM (Screw) ×1 IMPLANT
SPONGE LAP 18X18 5 PK (GAUZE/BANDAGES/DRESSINGS) ×3 IMPLANT
STAPLER SKIN PROX 35W (STAPLE) ×3 IMPLANT
STOCKINETTE M/LG 89821 (MISCELLANEOUS) ×3 IMPLANT
STOCKINETTE STRL 6IN 960660 (GAUZE/BANDAGES/DRESSINGS) ×3 IMPLANT
STRAP SAFETY BODY (MISCELLANEOUS) ×3 IMPLANT
SUT PROLENE 4 0 PS 2 18 (SUTURE) ×3 IMPLANT
SUT VIC AB 0 CT1 36 (SUTURE) ×3 IMPLANT
SUT VIC AB 2-0 SH 27 (SUTURE) ×4
SUT VIC AB 2-0 SH 27XBRD (SUTURE) ×2 IMPLANT
SYRINGE 10CC LL (SYRINGE) ×3 IMPLANT
ZIPTIGHT ANKLE SYNODESMOSS FIX (Ankle) ×6 IMPLANT
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
## 2015-02-02 NOTE — Transfer of Care (Signed)
Immediate Anesthesia Transfer of Care Note  Patient: Antonio Mccoy  Procedure(s) Performed: Procedure(s): OPEN REDUCTION INTERNAL FIXATION (ORIF) ANKLE FRACTURE (Left)  Patient Location: PACU  Anesthesia Type:General  Level of Consciousness: sedated  Airway & Oxygen Therapy: Patient Spontanous Breathing and Patient connected to face mask oxygen  Post-op Assessment: Report given to RN and Post -op Vital signs reviewed and stable  Post vital signs: stable  Last Vitals:  Filed Vitals:   02/02/15 1745  BP: 136/75  Pulse: 92  Temp: 36.2 C  Resp: 13    Complications: No apparent anesthesia complications
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
## 2015-02-02 NOTE — Progress Notes (Signed)
Patient a&o, vss. Pain in left ankle. IV pain medication given with relief. Splint in place on left ankle. Consent for surgery signed and placed in chart. NPO. Resting on bed at the moment.

## 2015-02-02 NOTE — Progress Notes (Signed)
Pt shivering at times  Applied warm blankets   More alert and responses to verbal stimulation
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
## 2015-02-02 NOTE — Progress Notes (Signed)
Another dose of labetalol 10mg  given for blood pressure of 158/109

## 2015-02-02 NOTE — Progress Notes (Signed)
Dr Teresa Peltongvs removed oral airway   labetelol 10mg  given for blood presurre of 166/115

## 2015-02-02 NOTE — Anesthesia Postprocedure Evaluation (Signed)
  Anesthesia Post-op Note  Patient: Antonio Mccoy  Procedure(s) Performed: Procedure(s): OPEN REDUCTION INTERNAL FIXATION (ORIF) ANKLE FRACTURE (Left)  Anesthesia type:General LMA  Patient location: PACU  Post pain: Pain level controlled  Post assessment: Post-op Vital signs reviewed, Patient's Cardiovascular Status Stable, Respiratory Function Stable, Patent Airway and No signs of Nausea or vomiting  Post vital signs: Reviewed and stable  Last Vitals:  Filed Vitals:   02/02/15 1745  BP: 136/75  Pulse: 92  Temp: 36.2 C  Resp: 13    Level of consciousness: awake, alert  and patient cooperative  Complications: No apparent anesthesia complications
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
## 2015-02-02 NOTE — Progress Notes (Signed)
Dr Teresa Peltongvs in to insert nasal trumpet for airway control

## 2015-02-02 NOTE — Care Management Note (Signed)
Case Management Note  Patient Details  Name: Antonio Mccoy MRN: [id number] Date of Birth: 1975-09-27  Subjective/Objective:                  Met with patient prior to surgery today. He states he lives with his girlfriend "for now". He states she will not provide much support for him at discharge. He has no health insurance listed but states he "should have Medicare disability". He will not have coverage for DME or home health if he is truly self-pay.  Action/Plan: RNCM will continue to follow for DME.   Expected Discharge Date:                  Expected Discharge Plan:     In-House Referral:     Discharge planning Services  CM Consult  Post Acute Care Choice:  Durable Medical Equipment Choice offered to:  Patient  DME Arranged:    DME Agency:     HH Arranged:    [place] Agency:     Status of Service:  In process, will continue to follow  Medicare Important Message Given:    Date Medicare IM Given:    Medicare IM give by:    Date Additional Medicare IM Given:    Additional Medicare Important Message give by:     If discussed at [place] of Stay Meetings, dates discussed:    Additional Comments:  Marshell Garfinkel, RN 02/02/2015, 11:09 AM

## 2015-02-02 NOTE — Anesthesia Procedure Notes (Signed)
Procedure Name: LMA Insertion Date/Time: 02/02/2015 3:21 PM Performed by: Irving BurtonBACHICH, Trendon Zaring Pre-anesthesia Checklist: Patient identified, Emergency Drugs available, Suction available and Patient being monitored Patient Re-evaluated:Patient Re-evaluated prior to inductionOxygen Delivery Method: Circle system utilized Preoxygenation: Pre-oxygenation with 100% oxygen Intubation Type: IV induction Ventilation: Mask ventilation without difficulty LMA: LMA inserted LMA Size: 4.5 Number of attempts: 1 Airway Equipment and Method: Patient positioned with wedge pillow Placement Confirmation: positive ETCO2 and breath sounds checked- equal and bilateral Tube secured with: Tape Dental Injury: Teeth and Oropharynx as per pre-operative assessment

## 2015-02-02 NOTE — Progress Notes (Signed)
Patient requesting more pain medication but is not due at this time. Dr Joice LoftsPoggi paged for any additional interventions that can be done. Waiting on call back.
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
## 2015-02-02 NOTE — Progress Notes (Signed)
Pt does not communicate with staff when asked questions  Blood pressure 154/105  Dr Teresa Peltongvs aware  No new orders

## 2015-02-02 NOTE — Anesthesia Preprocedure Evaluation (Addendum)
Anesthesia Evaluation  Patient identified by MRN, date of birth, ID band Patient awake    Reviewed: Allergy & Precautions, H&P , NPO status , Patient's Chart, lab work & pertinent test results  History of Anesthesia Complications Negative for: history of anesthetic complications  Airway Mallampati: II  TM Distance: >3 FB Neck ROM: full    Dental  (+) Poor Dentition, Chipped   Pulmonary neg shortness of breath, Current Smoker,    Pulmonary exam normal breath sounds clear to auscultation       Cardiovascular Exercise Tolerance: Good (-) angina(-) Past MI and (-) DOE negative cardio ROS Normal cardiovascular exam Rhythm:regular Rate:Normal     Neuro/Psych negative neurological ROS  negative psych ROS   GI/Hepatic negative GI ROS, Neg liver ROS,   Endo/Other  negative endocrine ROS  Renal/GU negative Renal ROS  negative genitourinary   Musculoskeletal   Abdominal   Peds  Hematology negative hematology ROS (+)   Anesthesia Other Findings History reviewed. No pertinent past medical history.  Past Surgical History:   SMALL INTESTINE SURGERY                                      BMI    Body Mass Index   28.87 kg/m 2      Reproductive/Obstetrics negative OB ROS                            Anesthesia Physical Anesthesia Plan  ASA: II  Anesthesia Plan: General LMA   Post-op Pain Management:    Induction:   Airway Management Planned:   Additional Equipment:   Intra-op Plan:   Post-operative Plan:   Informed Consent: I have reviewed the patients History and Physical, chart, labs and discussed the procedure including the risks, benefits and alternatives for the proposed anesthesia with the patient or authorized representative who has indicated his/her understanding and acceptance.   Dental Advisory Given  Plan Discussed with: Anesthesiologist, CRNA and Surgeon  Anesthesia Plan  Comments:         Anesthesia Quick Evaluation

## 2015-02-03 ENCOUNTER — Encounter: Payer: Self-pay | Admitting: Surgery

## 2015-02-03 LAB — BASIC METABOLIC PANEL
ANION GAP: 5 (ref 5–15)
BUN: <5 mg/dL — ABNORMAL LOW (ref 6–20)
CALCIUM: 8.2 mg/dL — AB (ref 8.9–10.3)
CO2: 28 mmol/L (ref 22–32)
CREATININE: 1.07 mg/dL (ref 0.61–1.24)
Chloride: 100 mmol/L — ABNORMAL LOW (ref 101–111)
GLUCOSE: 126 mg/dL — AB (ref 65–99)
Potassium: 3.6 mmol/L (ref 3.5–5.1)
Sodium: 133 mmol/L — ABNORMAL LOW (ref 135–145)

## 2015-02-03 LAB — CBC WITH DIFFERENTIAL/PLATELET
BASOS ABS: 0 10*3/uL (ref 0–0.1)
BASOS PCT: 0 %
EOS PCT: 1 %
Eosinophils Absolute: 0.1 10*3/uL (ref 0–0.7)
HEMATOCRIT: 41.7 % (ref 40.0–52.0)
Hemoglobin: 14.4 g/dL (ref 13.0–18.0)
Lymphocytes Relative: 17 %
Lymphs Abs: 1.5 10*3/uL (ref 1.0–3.6)
MCH: 34 pg (ref 26.0–34.0)
MCHC: 34.5 g/dL (ref 32.0–36.0)
MCV: 98.5 fL (ref 80.0–100.0)
MONO ABS: 0.8 10*3/uL (ref 0.2–1.0)
MONOS PCT: 8 %
NEUTROS ABS: 6.7 10*3/uL — AB (ref 1.4–6.5)
Neutrophils Relative %: 74 %
PLATELETS: 160 10*3/uL (ref 150–440)
RBC: 4.24 MIL/uL — ABNORMAL LOW (ref 4.40–5.90)
RDW: 13.7 % (ref 11.5–14.5)
WBC: 9.1 10*3/uL (ref 3.8–10.6)

## 2015-02-03 MED ORDER — OXYCODONE HCL 5 MG PO TABS: 5.0000 mg | ORAL_TABLET | ORAL | Status: DC | PRN
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
## 2015-02-03 NOTE — Progress Notes (Signed)
Discharged instructions/prescriptions given; acknowledged understanding.
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
## 2015-02-03 NOTE — Progress Notes (Signed)
  Subjective: 1 Day Post-Op   Patient reports pain as mild with a throbbing sensation..   Patient is well, and has had no acute complaints or problems Plan is to go Home after hospital stay. Negative for chest pain and shortness of breath Fever: no Gastrointestinal:Negative for nausea and vomiting  Objective: Vital signs in last 24 hours: Temp:  [97.2 F (36.2 C)-100.1 F (37.8 C)] 98.5 F (36.9 C) (11/01 0758) Pulse Rate:  [73-97] 73 (11/01 0758) Resp:  [13-19] 16 (11/01 0758) BP: (113-166)/(58-115) 113/58 mmHg (11/01 0758) SpO2:  [92 %-100 %] 96 % (11/01 0758)  Intake/Output from previous day:  Intake/Output Summary (Last 24 hours) at 02/03/15 1056 Last data filed at 02/03/15 1000  Gross per 24 hour  Intake 2948.33 ml  Output   1980 ml  Net 968.33 ml    Intake/Output this shift: Total I/O In: 120 [P.O.:120] Out: 100 [Urine:100]  Labs:  Recent Labs  02/02/15 0107 02/03/15 0624  HGB 16.9 14.4    Recent Labs  02/02/15 0107 02/03/15 0624  WBC 7.8 9.1  RBC 4.97 4.24*  HCT 48.4 41.7  PLT 227 160    Recent Labs  02/02/15 0107 02/03/15 0624  NA 139 133*  K 3.0* 3.6  CL 101 100*  CO2 23 28  BUN 10 <5*  CREATININE 1.14 1.07  GLUCOSE 100* 126*  CALCIUM 9.0 8.2*   No results for input(s): LABPT, INR in the last 72 hours.   EXAM General - Patient is Alert, Appropriate and Oriented Extremity - Neurologically intact ABD soft Sensation intact distally Dressing/Incision - clean, dry, no drainage Motor Function - intact, moving foot and toes well on exam.   Pt in a post-op splint. Capillary refill intact to each individual toe.  Pt able to flex and extend toes on command. Sensation intact to light touch to each individual toe. Abdomen soft with no distention, normal BS.  History reviewed. No pertinent past medical history.  Assessment/Plan: 1 Day Post-Op   Active Problems:   Ankle fracture  Estimated body mass index is 28.87 kg/(m^2) as  calculated from the following:   Height as of this encounter: 5\' 9"  (1.753 m).   Weight as of this encounter: 88.724 kg (195 lb 9.6 oz). Advance diet Up with therapy D/C IV fluids   Pt has urinated without a foley. Pt has been evaluated by PT, recommending outpatient PT, will be discharged with crutches. D/C IVF when tolerating PO intake.  He denies any N/V. Plan on discharge home today.  DVT Prophylaxis - Lovenox, Foot Pumps and TED hose Non-weight-Bearing to left leg  J. Horris LatinoLance Lalana Wachter, PA-C [facility] 02/03/2015, 10:56 AM

## 2015-02-03 NOTE — Progress Notes (Signed)
Clinical Child psychotherapistocial Worker (CSW) received SNF consult. PT is recommending outpatient PT. RN Case Manager is aware of above. Please reconsult if future social work needs arise. CSW signing off.   Jetta LoutBailey Morgan, LCSWA [phone]

## 2015-02-03 NOTE — Care Management (Addendum)
OPPT recommended by PT. Patient's lady friend at bedside. I spoke with patient's girlfriend and patient refused [facility] offer for OPPT. Crutches delivered by Will with [facility]. Paged Micah NoelLance PA for Rx for crutches.
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
## 2015-02-03 NOTE — Discharge Instructions (Signed)
Diet: As you were doing prior to hospitalization   Shower:  May shower but keep the wounds dry, use an occlusive plastic wrap, NO SOAKING IN TUB.  If the bandage gets wet, change with a clean dry gauze.  Dressing: Keep left lower leg extremity splint in-place.  Do not remove.    Activity:  Increase activity slowly as tolerated, but follow the weight bearing instructions below.  No lifting or driving for 6 weeks.  Weight Bearing:   Non-weight bearing to the left lower extremity.  Use crutches for assistance with ambulation.  To prevent constipation: you may use a stool softener such as -  Colace (over the counter) 100 mg by mouth twice a day  Drink plenty of fluids (prune juice may be helpful) and high fiber foods Miralax (over the counter) for constipation as needed.    Itching:  If you experience itching with your medications, try taking only a single pain pill, or even half a pain pill at a time.  You may take up to 10 pain pills per day, and you can also use benadryl over the counter for itching or also to help with sleep.   Precautions:  If you experience chest pain or shortness of breath - call 911 immediately for transfer to the hospital emergency department!!  If you develop a fever greater that 101 F, purulent drainage from wound, increased redness or drainage from wound, or calf pain-Call [facility]            Continue Aspirin 81mg  daily for DVT prophylaxis for 14 days.                                     Follow- Up Appointment:  Please call for [facility]

## 2015-04-01 ENCOUNTER — Emergency Department: Payer: Self-pay

## 2015-04-01 ENCOUNTER — Emergency Department
Admission: EM | Admit: 2015-04-01 | Discharge: 2015-04-01 | Disposition: A | Discharge status: Home / Self Care | Payer: Self-pay | Attending: Emergency Medicine | Admitting: Emergency Medicine

## 2015-04-01 ENCOUNTER — Encounter: Payer: Self-pay | Admitting: Emergency Medicine

## 2015-04-01 DIAGNOSIS — F17200 Nicotine dependence, unspecified, uncomplicated: Secondary | ICD-10-CM | POA: Insufficient documentation

## 2015-04-01 DIAGNOSIS — W010XXA Fall on same level from slipping, tripping and stumbling without subsequent striking against object, initial encounter: Secondary | ICD-10-CM | POA: Insufficient documentation

## 2015-04-01 DIAGNOSIS — Y92002 Bathroom of unspecified non-institutional (private) residence single-family (private) house as the place of occurrence of the external cause: Secondary | ICD-10-CM | POA: Insufficient documentation

## 2015-04-01 DIAGNOSIS — S99911A Unspecified injury of right ankle, initial encounter: Secondary | ICD-10-CM

## 2015-04-01 DIAGNOSIS — S93402A Sprain of unspecified ligament of left ankle, initial encounter: Secondary | ICD-10-CM | POA: Insufficient documentation

## 2015-04-01 DIAGNOSIS — Y998 Other external cause status: Secondary | ICD-10-CM | POA: Insufficient documentation

## 2015-04-01 DIAGNOSIS — Y9389 Activity, other specified: Secondary | ICD-10-CM | POA: Insufficient documentation

## 2015-04-01 MED ORDER — TRAMADOL HCL 50 MG PO TABS: 50.0000 mg | ORAL_TABLET | Freq: Two times a day (BID) | ORAL | Status: DC

## 2015-04-01 NOTE — ED Notes (Signed)
Pt comes into the ED c/o left ankle pain.  Patient states he broke it in October and it is still healing.  Patient was getting out of bathtub today when he slipped and fell on that ankle.

## 2015-04-01 NOTE — Discharge Instructions (Signed)
Ankle Sprain An ankle sprain is an injury to the strong, fibrous tissues (ligaments) that hold your ankle bones together.  HOME CARE   Put ice on your ankle for 1-2 days or as told by your doctor.  Put ice in a plastic bag.  Place a towel between your skin and the bag.  Leave the ice on for 15-20 minutes at a time, every 2 hours while you are awake.  Only take medicine as told by your doctor.  Raise (elevate) your injured ankle above the level of your heart as much as possible for 2-3 days.  Use crutches if your doctor tells you to. Slowly put your own weight on the affected ankle. Use the crutches until you can walk without pain.  If you have a plaster splint:  Do not rest it on anything harder than a pillow for 24 hours.  Do not put weight on it.  Do not get it wet.  Take it off to shower or bathe.  If given, use an elastic wrap or support stocking for support. Take the wrap off if your toes lose feeling (numb), tingle, or turn cold or blue.  If you have an air splint:  Add or let out air to make it comfortable.  Take it off at night and to shower and bathe.  Wiggle your toes and move your ankle up and down often while you are wearing it. GET HELP IF:  You have rapidly increasing bruising or puffiness (swelling).  Your toes feel very cold.  You lose feeling in your foot.  Your medicine does not help your pain. GET HELP RIGHT AWAY IF:   Your toes lose feeling (numb) or turn blue.  You have severe pain that is increasing. MAKE SURE YOU:   Understand these instructions.  Will watch your condition.  Will get help right away if you are not doing well or get worse.   This information is not intended to replace advice given to you by your health care provider. Make sure you discuss any questions you have with your health care provider.   Document Released: 09/07/2007 Document Revised: 04/11/2014 Document Reviewed: 10/03/2011 Elsevier Interactive Patient  Education 2016 [employer]  Your exam and x-ray are normal. There is no new fracture or dislocation to the ankle. Follow-up with Dr. Joice LoftsPoggi for further care.
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
## 2015-04-17 ENCOUNTER — Emergency Department
Admission: EM | Admit: 2015-04-17 | Discharge: 2015-04-18 | Disposition: A | Discharge status: Other Acute Inpatient Hospital | Payer: Self-pay | Attending: Emergency Medicine | Admitting: Emergency Medicine

## 2015-04-17 DIAGNOSIS — F17200 Nicotine dependence, unspecified, uncomplicated: Secondary | ICD-10-CM | POA: Insufficient documentation

## 2015-04-17 DIAGNOSIS — F209 Schizophrenia, unspecified: Secondary | ICD-10-CM | POA: Insufficient documentation

## 2015-04-17 DIAGNOSIS — R000 Tachycardia, unspecified: Secondary | ICD-10-CM | POA: Insufficient documentation

## 2015-04-17 DIAGNOSIS — Z79899 Other long term (current) drug therapy: Secondary | ICD-10-CM | POA: Insufficient documentation

## 2015-04-17 DIAGNOSIS — F10120 Alcohol abuse with intoxication, uncomplicated: Secondary | ICD-10-CM | POA: Insufficient documentation

## 2015-04-17 DIAGNOSIS — F10920 Alcohol use, unspecified with intoxication, uncomplicated: Secondary | ICD-10-CM

## 2015-04-18 ENCOUNTER — Encounter: Payer: Self-pay | Admitting: Emergency Medicine

## 2015-04-18 ENCOUNTER — Emergency Department: Payer: Self-pay

## 2015-04-18 ENCOUNTER — Inpatient Hospital Stay
Admission: EM | Admit: 2015-04-18 | Discharge: 2015-04-20 | DRG: 885 | Disposition: A | Discharge status: Home / Self Care | Payer: No Typology Code available for payment source | Source: Intra-hospital | Attending: Psychiatry | Admitting: Psychiatry

## 2015-04-18 DIAGNOSIS — G4700 Insomnia, unspecified: Secondary | ICD-10-CM | POA: Diagnosis present

## 2015-04-18 DIAGNOSIS — F22 Delusional disorders: Secondary | ICD-10-CM | POA: Diagnosis present

## 2015-04-18 DIAGNOSIS — F1290 Cannabis use, unspecified, uncomplicated: Secondary | ICD-10-CM | POA: Diagnosis present

## 2015-04-18 DIAGNOSIS — F1020 Alcohol dependence, uncomplicated: Secondary | ICD-10-CM

## 2015-04-18 DIAGNOSIS — F10120 Alcohol abuse with intoxication, uncomplicated: Secondary | ICD-10-CM

## 2015-04-18 DIAGNOSIS — F10239 Alcohol dependence with withdrawal, unspecified: Secondary | ICD-10-CM | POA: Diagnosis present

## 2015-04-18 DIAGNOSIS — F203 Undifferentiated schizophrenia: Principal | ICD-10-CM | POA: Diagnosis present

## 2015-04-18 DIAGNOSIS — Z9114 Patient's other noncompliance with medication regimen: Secondary | ICD-10-CM

## 2015-04-18 DIAGNOSIS — Z79899 Other long term (current) drug therapy: Secondary | ICD-10-CM

## 2015-04-18 DIAGNOSIS — F17210 Nicotine dependence, cigarettes, uncomplicated: Secondary | ICD-10-CM | POA: Diagnosis present

## 2015-04-18 DIAGNOSIS — F17200 Nicotine dependence, unspecified, uncomplicated: Secondary | ICD-10-CM

## 2015-04-18 DIAGNOSIS — S82899A Other fracture of unspecified lower leg, initial encounter for closed fracture: Secondary | ICD-10-CM | POA: Diagnosis present

## 2015-04-18 DIAGNOSIS — E8881 Metabolic syndrome: Secondary | ICD-10-CM | POA: Diagnosis present

## 2015-04-18 DIAGNOSIS — F209 Schizophrenia, unspecified: Secondary | ICD-10-CM

## 2015-04-18 DIAGNOSIS — F10939 Alcohol use, unspecified with withdrawal, unspecified: Secondary | ICD-10-CM | POA: Diagnosis present

## 2015-04-18 LAB — COMPREHENSIVE METABOLIC PANEL
ALT: 25 U/L (ref 17–63)
AST: 43 U/L — ABNORMAL HIGH (ref 15–41)
Albumin: 4.1 g/dL (ref 3.5–5.0)
Alkaline Phosphatase: 96 U/L (ref 38–126)
Anion gap: 8 (ref 5–15)
BUN: 7 mg/dL (ref 6–20)
CHLORIDE: 105 mmol/L (ref 101–111)
CO2: 27 mmol/L (ref 22–32)
CREATININE: 0.96 mg/dL (ref 0.61–1.24)
Calcium: 8.6 mg/dL — ABNORMAL LOW (ref 8.9–10.3)
Glucose, Bld: 97 mg/dL (ref 65–99)
POTASSIUM: 3.5 mmol/L (ref 3.5–5.1)
Sodium: 140 mmol/L (ref 135–145)
TOTAL PROTEIN: 7.5 g/dL (ref 6.5–8.1)
Total Bilirubin: 1 mg/dL (ref 0.3–1.2)

## 2015-04-18 LAB — URINE DRUG SCREEN, QUALITATIVE (ARMC ONLY)
AMPHETAMINES, UR SCREEN: NOT DETECTED
BENZODIAZEPINE, UR SCRN: NOT DETECTED
Barbiturates, Ur Screen: NOT DETECTED
Cannabinoid 50 Ng, Ur ~~LOC~~: NOT DETECTED
Cocaine Metabolite,Ur ~~LOC~~: NOT DETECTED
MDMA (Ecstasy)Ur Screen: NOT DETECTED
METHADONE SCREEN, URINE: NOT DETECTED
OPIATE, UR SCREEN: NOT DETECTED
Phencyclidine (PCP) Ur S: NOT DETECTED
TRICYCLIC, UR SCREEN: NOT DETECTED

## 2015-04-18 LAB — CBC
HEMATOCRIT: 46.5 % (ref 40.0–52.0)
HEMOGLOBIN: 15.9 g/dL (ref 13.0–18.0)
MCH: 32.6 pg (ref 26.0–34.0)
MCHC: 34.1 g/dL (ref 32.0–36.0)
MCV: 95.5 fL (ref 80.0–100.0)
Platelets: 260 10*3/uL (ref 150–440)
RBC: 4.87 MIL/uL (ref 4.40–5.90)
RDW: 15.1 % — AB (ref 11.5–14.5)
WBC: 8 10*3/uL (ref 3.8–10.6)

## 2015-04-18 LAB — ETHANOL: ALCOHOL ETHYL (B): 266 mg/dL — AB (ref <5)

## 2015-04-18 MED ORDER — FOLIC ACID 1 MG PO TABS
1.0000 mg | ORAL_TABLET | Freq: Every day | ORAL | Status: DC
  Administered 2015-04-18 – 2015-04-19 (×2): 1 mg via ORAL
  Filled 2015-04-18 (×2): qty 1

## 2015-04-18 MED ORDER — HALOPERIDOL 5 MG PO TABS
5.0000 mg | ORAL_TABLET | Freq: Two times a day (BID) | ORAL | Status: DC
  Administered 2015-04-18 – 2015-04-20 (×4): 5 mg via ORAL
  Filled 2015-04-18 (×4): qty 1

## 2015-04-18 MED ORDER — CHLORDIAZEPOXIDE HCL 25 MG PO CAPS
25.0000 mg | ORAL_CAPSULE | Freq: Three times a day (TID) | ORAL | Status: DC
  Administered 2015-04-18 – 2015-04-19 (×2): 25 mg via ORAL
  Filled 2015-04-18 (×3): qty 1

## 2015-04-18 MED ORDER — IBUPROFEN 800 MG PO TABS
800.0000 mg | ORAL_TABLET | Freq: Three times a day (TID) | ORAL | Status: DC | PRN
  Administered 2015-04-18 – 2015-04-19 (×3): 800 mg via ORAL
  Filled 2015-04-18 (×3): qty 1

## 2015-04-18 MED ORDER — IBUPROFEN 800 MG PO TABS
800.0000 mg | ORAL_TABLET | Freq: Three times a day (TID) | ORAL | Status: DC | PRN
  Administered 2015-04-18: 800 mg via ORAL
  Filled 2015-04-18: qty 1

## 2015-04-18 MED ORDER — VITAMIN B-1 100 MG PO TABS
100.0000 mg | ORAL_TABLET | Freq: Every day | ORAL | Status: DC
  Administered 2015-04-18 – 2015-04-19 (×2): 100 mg via ORAL
  Filled 2015-04-18 (×2): qty 1

## 2015-04-18 NOTE — Plan of Care (Signed)
Problem: Diagnosis: Increased Risk For Suicide Attempt Goal: STG-Patient Will Comply With Medication Regime Outcome: Progressing Patient has been compliant with medication since beginning of shift.

## 2015-04-18 NOTE — ED Notes (Signed)
Pt given the phone for use, pt explained that he got one 10 minute phone call, pt given 3 minute warning that time was almost up. This tech asked pt to give the phone back and pt says "fck you" and dials another number and refuses to give this tech the phone back. Officer advised and received phone back. Pt starts yelling and cussing at staff members threatening staff.

## 2015-04-18 NOTE — ED Provider Notes (Signed)
[facility] Emergency Department Provider Note  ____________________________________________  Time seen: On arrival  I have reviewed the triage vital signs and the nursing notes.   HISTORY  Chief Complaint Aggressive Behavior    HPI Antonio Mccoy is a 40 y.o. male who is [place] in [place] [employer]. Reportedly his sister filed IVC paperwork. Patient has a history of schizophrenia and has apparently been noncompliant with his medications and is he has had increasingly bizarre and aggressive behavior. Patient reports he has been compliant. He denies physical complaints    History reviewed. No pertinent past medical history.  Patient Active Problem List   Diagnosis Date Noted  . Ankle fracture 02/02/2015    Past Surgical History  Procedure Laterality Date  . Small intestine surgery    . Orif ankle fracture Left 02/02/2015    Procedure: OPEN REDUCTION INTERNAL FIXATION (ORIF) ANKLE FRACTURE;  Surgeon: Christena FlakeJohn J Poggi, MD;  Location: [facility] ORS;  Service: Orthopedics;  Laterality: Left;    Current Outpatient Rx  Name  Route  Sig  Dispense  Refill  . oxyCODONE (OXY IR/ROXICODONE) 5 MG immediate release tablet   Oral   Take 1-2 tablets (5-10 mg total) by mouth every 4 (four) hours as needed for breakthrough pain.   60 tablet   0   . traMADol (ULTRAM) 50 MG tablet   Oral   Take 1 tablet (50 mg total) by mouth 2 (two) times daily.   10 tablet   0     Allergies Review of patient's allergies indicates no known allergies.  History reviewed. No pertinent family history.  Social History Social History  Substance Use Topics  . Smoking status: Current Every Day Smoker -- 1.00 packs/day  . Smokeless tobacco: None  . Alcohol Use: Yes    Review of Systems  Constitutional: Denies fever Eyes: Denies visual changes. ENT: Denies sore throat Cardiovascular: Denies  chest pain. Respiratory: Denies  shortness of  breath. Gastrointestinal: Denies abdominal pain, vomiting and diarrhea. Genitourinary: Denies dysuria. Musculoskeletal: Denies back pain. Skin: Denies rash. Neurological: Denies headaches     ____________________________________________   PHYSICAL EXAM:  VITAL SIGNS: ED Triage Vitals  Enc Vitals Group     BP 04/18/15 0007 155/90 mmHg     Pulse Rate 04/18/15 0007 112     Resp 04/18/15 0007 20     Temp 04/18/15 0007 98.1 F (36.7 C)     Temp src --      SpO2 04/18/15 0007 94 %     Weight 04/18/15 0007 210 lb (95.255 kg)     Height 04/18/15 0007 5\' 10"  (1.778 m)     Head Cir --      Peak Flow --      Pain Score 04/18/15 0008 10     Pain Loc --      Pain Edu? --      Excl. in GC? --      Constitutional: Alert and oriented. Aggressive posturing but no acute distress Eyes: Conjunctivae are normal.  ENT   Head: Normocephalic and atraumatic.   Mouth/Throat: Mucous membranes are moist. Cardiovascular: Tachycardia, regular rhythm. Normal and symmetric distal pulses are present in all extremities. No murmurs, rubs, or gallops. Respiratory: Normal respiratory effort without tachypnea nor retractions. Breath sounds are clear and equal bilaterally.  Gastrointestinal: Soft and non-tender in all quadrants. No distention. There is no CVA tenderness. Genitourinary: deferred Musculoskeletal: Nontender with normal range of motion in all extremities. No lower  extremity tenderness nor edema. Neurologic:  Normal speech and language. No gross focal neurologic deficits are appreciated. Skin:  Skin is warm, dry and intact. No rash noted. Psychiatric: Mood and affect are normal. Patient exhibits appropriate insight and judgment.  ____________________________________________    LABS (pertinent positives/negatives)  Labs Reviewed  COMPREHENSIVE METABOLIC PANEL - Abnormal; Notable for the following:    Calcium 8.6 (*)    AST 43 (*)    All other components within normal limits   ETHANOL - Abnormal; Notable for the following:    Alcohol, Ethyl (B) 266 (*)    All other components within normal limits  CBC - Abnormal; Notable for the following:    RDW 15.1 (*)    All other components within normal limits  URINE DRUG SCREEN, QUALITATIVE ([facility] ONLY)    ____________________________________________   EKG  None  ____________________________________________    RADIOLOGY I have personally reviewed any xrays that were ordered on this patient: None  ____________________________________________   PROCEDURES  Procedure(s) performed: none  Critical Care performed: none  ____________________________________________   INITIAL IMPRESSION / ASSESSMENT AND PLAN / ED COURSE  Pertinent labs & imaging results that were available during my care of the patient were reviewed by me and considered in my medical decision making (see chart for details).  Patient with history of schizophrenia and reported noncompliance of medications. We will check labs, consult TTS and psychiatry.  Labs are unremarkable except for significantly elevated alcohol. I have completed the involuntary commitment.  Pending psychiatry consultation  ____________________________________________   FINAL CLINICAL IMPRESSION(S) / ED DIAGNOSES  Final diagnoses:  Schizophrenia, unspecified type (HCC)  Alcohol intoxication, uncomplicated (HCC)     Jene Every, MD 04/18/15 [phone]

## 2015-04-18 NOTE — Progress Notes (Signed)
Patient is to be admitted to [facility] by Dr. Garnetta BuddyFaheem.  Attending Physician will be Dr. Jennet MaduroPucilowska.   [facility] Sue [facility]ush[facility].   Intake Paper Work has been signed and placed on patient chart.  ER staff is aware of the admission ( ER Sect.; Dr. Fanny BienQuale, ER MD; Gerilyn PilgrimJacob Patient's Nurse & Fara BorosVirgina Patient Access).  04/18/2015 Cheryl FlashNicole Patrice Moates, MS, NCC, LPCA Therapeutic Triage Specialist
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
## 2015-04-18 NOTE — ED Notes (Signed)

## 2015-04-18 NOTE — ED Provider Notes (Signed)
Patient be admitted to psychiatry under Dr. Marilynn LatinoFaheme. Diagnosis of schizophrenia.  Sharyn CreamerMark Quale, MD 04/18/15 [phone]

## 2015-04-18 NOTE — Tx Team (Signed)
Initial Interdisciplinary Treatment Plan   PATIENT STRESSORS: Medication change or noncompliance Substance abuse   PATIENT STRENGTHS: General fund of knowledge Supportive family/friends   PROBLEM LIST: Problem List/Patient Goals Date to be addressed Date deferred Reason deferred Estimated date of resolution  Substance Abuse      Limited Mobility d/t lt ankle fx/surgery-wears boot      Impulsivity                                           DISCHARGE CRITERIA:  Ability to meet basic life and health needs Improved stabilization in mood, thinking, and/or behavior Motivation to continue treatment in a less acute level of care Need for constant or close observation no longer present Withdrawal symptoms are absent or subacute and managed without 24-hour nursing intervention  PRELIMINARY DISCHARGE PLAN: Attend aftercare/continuing care group Attend 12-step recovery group Outpatient therapy Return to previous living arrangement  PATIENT/FAMIILY INVOLVEMENT: This treatment plan has been presented to and reviewed with the patient, Antonio Mccoy.  The patient and family have been given the opportunity to ask questions and make suggestions.  Lauris Poagndrea B Kristine Tiley 04/18/2015, 6:35 PM

## 2015-04-18 NOTE — ED Notes (Signed)
Pt. States he called his sister and that sister called another sister who called [employer] and filed IVC paperwork.

## 2015-04-18 NOTE — ED Notes (Signed)
Pt. States "I have schizophrenia".  Pt. States "I have been taking my medications"
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
## 2015-04-18 NOTE — ED Notes (Signed)
Pt. Here under IVC via [employer].
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
## 2015-04-19 DIAGNOSIS — F17200 Nicotine dependence, unspecified, uncomplicated: Secondary | ICD-10-CM

## 2015-04-19 DIAGNOSIS — F10239 Alcohol dependence with withdrawal, unspecified: Secondary | ICD-10-CM | POA: Diagnosis present

## 2015-04-19 DIAGNOSIS — F1020 Alcohol dependence, uncomplicated: Secondary | ICD-10-CM

## 2015-04-19 DIAGNOSIS — F10939 Alcohol use, unspecified with withdrawal, unspecified: Secondary | ICD-10-CM | POA: Diagnosis present

## 2015-04-19 DIAGNOSIS — F209 Schizophrenia, unspecified: Secondary | ICD-10-CM

## 2015-04-19 MED ORDER — TRAZODONE HCL 100 MG PO TABS
100.0000 mg | ORAL_TABLET | Freq: Once | ORAL | Status: AC | PRN
  Administered 2015-04-19: 100 mg via ORAL
  Filled 2015-04-19: qty 1

## 2015-04-19 MED ORDER — ACETAMINOPHEN 325 MG PO TABS
650.0000 mg | ORAL_TABLET | Freq: Four times a day (QID) | ORAL | Status: DC | PRN
  Administered 2015-04-20 (×2): 650 mg via ORAL
  Filled 2015-04-19: qty 2

## 2015-04-19 MED ORDER — ALUM & MAG HYDROXIDE-SIMETH 200-200-20 MG/5ML PO SUSP
30.0000 mL | ORAL | Status: DC | PRN
Start: 2015-04-19 — End: 2015-04-20

## 2015-04-19 MED ORDER — NICOTINE 21 MG/24HR TD PT24
21.0000 mg | MEDICATED_PATCH | Freq: Every day | TRANSDERMAL | Status: DC
  Filled 2015-04-19 (×2): qty 1

## 2015-04-19 MED ORDER — TRAZODONE HCL 100 MG PO TABS: 100.0000 mg | ORAL_TABLET | Freq: Every evening | ORAL | Status: DC | PRN

## 2015-04-19 MED ORDER — DIPHENHYDRAMINE HCL 25 MG PO CAPS
50.0000 mg | ORAL_CAPSULE | Freq: Every day | ORAL | Status: DC
  Administered 2015-04-19: 50 mg via ORAL
  Filled 2015-04-19: qty 2

## 2015-04-19 MED ORDER — MAGNESIUM HYDROXIDE 400 MG/5ML PO SUSP: 30.0000 mL | Freq: Every day | ORAL | Status: DC | PRN

## 2015-04-19 NOTE — [facility] Suicide Risk Assessment (Signed)
[facility] Admission Suicide Risk Assessment   Nursing information obtained from:  Patient Demographic factors:  Male, Low socioeconomic status Current Mental Status:  NA Loss Factors:  NA Historical Factors:  Impulsivity Risk Reduction Factors:  Positive social support, Living with another person, especially a relative Total Time spent with patient: 1 hour Principal Problem: Schizophrenia (HCC) Diagnosis:   Patient Active Problem List   Diagnosis Date Noted  . 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 Alcohol use disorder, severe, dependence (HCC) [F10.20] 04/19/2015  . Alcohol withdrawal (HCC) [F10.239] 04/19/2015  . Tobacco use disorder [F17.200] 04/19/2015  . Schizophrenia (HCC) [F20.9]   . Ankle fracture [S82.899A] 02/02/2015     Continued Clinical Symptoms:  Alcohol Use Disorder Identification Test Final Score (AUDIT): 6 The "Alcohol Use Disorders Identification Test", Guidelines for Use in Primary Care, Second Edition.  World Science writer [facility]). Score between 0-7:  no or low risk or alcohol related problems. Score between 8-15:  moderate risk of alcohol related problems. Score between 16-19:  high risk of alcohol related problems. Score 20 or above:  warrants further diagnostic evaluation for alcohol dependence and treatment.   CLINICAL FACTORS:   Alcohol/Substance Abuse/Dependencies Schizophrenia:   Less than 20 years old Paranoid or undifferentiated type Previous Psychiatric Diagnoses and Treatments   Musculoskeletal:  Psychiatric Specialty Exam: Physical Exam  ROS    COGNITIVE FEATURES THAT CONTRIBUTE TO RISK:  Closed-mindedness    SUICIDE RISK:   Mild:  Suicidal ideation of limited frequency, intensity, duration, and specificity.  There are no identifiable plans, no associated intent, mild dysphoria and related symptoms, good self-control (both  objective and subjective assessment), few other risk factors, and identifiable protective factors, including available and accessible social support.  PLAN OF CARE: admit to [facility]  Medical Decision Making:  Established Problem, Worsening (2)  I certify that inpatient services furnished can reasonably be expected to improve the patient's condition.   Antonio Mccoy 04/19/2015, 12:35 PM

## 2015-04-19 NOTE — Progress Notes (Signed)
Patient CIWA 0. Patient refuses Librium 25 mg po as ordered. No distress, no complaint. On the phone talking, combing out his hair.

## 2015-04-19 NOTE — [facility] Group Notes (Signed)
[facility] LCSW Group Therapy  04/19/2015 5:38 PM  Type of Therapy:  Group Therapy  Participation Level:  Did Not Attend   Modes of Intervention:  Discussion, Education, Socialization and Support  Summary of Progress/Problems: Todays topic: Grudges  Patients will be encouraged to discuss their thoughts, feelings, and behaviors as to why one holds on to grudges and reasons why people have grudges. Patients will process the impact of grudges on their daily lives and identify thoughts and feelings related to holding grudges. Patients will identify feelings and thoughts related to what life would look like without grudges.   Ruchi Stoney L Keisa Blow MSW, LCSWA  04/19/2015, 5:38 PM   

## 2015-04-19 NOTE — Progress Notes (Signed)
D: Patient appears anxious on the unit. He is seen talking on the phones with his voice elevated. When he's not on the phone he's visible in the milieu. He denies SI/HI/AVH. He reports pain in his ankle that is broke. MD on call ordered ibuprofen.  A: Medication was given education. Pain medication was given. Encouragement was provided.  R: Patient was compliant with medication. He has been pleasant and cooperative. Safety maintained with 15 min checks.

## 2015-04-19 NOTE — Plan of Care (Signed)
Problem: [facility] Patient Education Outcome: Progressing Patient with depressed affect, cooperative behavior with meals, meds and plan of care. No SI/HI/AVH at this time. Refuses Nicotene patch stating " I don't always smoke". Fair adls. Withdrawn and quiet in bed this am, states the HS sleep med really "made him still feel tired this am". Encouraged to let MD know this am but patient states "No it is fine". Quiet with peers. Uses wheelchair to make way thru unit with good technique. Verbalizes needs appropriately to staff. Remains fall risk rt left ankle boot. Right shoe given to patient from locker at his request with no shoelaces. Uses prn Motrin 800 mg po for ankle discomfort. Safety maintained.
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
## 2015-04-20 ENCOUNTER — Encounter: Payer: Self-pay | Admitting: Psychiatry

## 2015-04-20 DIAGNOSIS — F203 Undifferentiated schizophrenia: Principal | ICD-10-CM

## 2015-04-20 LAB — LIPID PANEL
CHOLESTEROL: 154 mg/dL (ref 0–200)
HDL: 75 mg/dL (ref >40)
LDL Cholesterol: 51 mg/dL (ref 0–99)
TRIGLYCERIDES: 142 mg/dL (ref <150)
Total CHOL/HDL Ratio: 2.1 RATIO
VLDL: 28 mg/dL (ref 0–40)

## 2015-04-20 LAB — HEMOGLOBIN A1C: Hgb A1c MFr Bld: 5 % (ref 4.0–6.0)

## 2015-04-20 MED ORDER — HALOPERIDOL 5 MG PO TABS: 5.0000 mg | ORAL_TABLET | Freq: Two times a day (BID) | ORAL | Status: DC

## 2015-04-20 MED ORDER — BENZTROPINE MESYLATE 1 MG PO TABS: 1.0000 mg | ORAL_TABLET | Freq: Two times a day (BID) | ORAL | Status: DC

## 2015-04-20 MED ORDER — BENZTROPINE MESYLATE 1 MG PO TABS
1.0000 mg | ORAL_TABLET | Freq: Two times a day (BID) | ORAL | Status: DC
  Administered 2015-04-20: 1 mg via ORAL
  Filled 2015-04-20: qty 1
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
## 2015-04-20 NOTE — Progress Notes (Signed)
D/C instructions/meds/follow-up appointments reviewed, pt verbalized understanding, pt's belongings returned to pt, samples given, denies SI/HI/AVH 

## 2015-04-20 NOTE — Progress Notes (Signed)
Patient denies SI/HI during shift. Patient has c/o ankle pain. PRN given. Safety maintained during shift. Will continue to monitor.

## 2015-04-20 NOTE — [facility] Group Notes (Signed)
[facility] Group Notes:  (Nursing/MHT/Case Management/Adjunct)  Date:  04/20/2015  Time:  12:53 PM  Type of Therapy:  Group Therapy  Participation Level:  Active  Participation Quality:  Appropriate  Affect:  Appropriate  Cognitive:  Appropriate  Insight:  Good  Engagement in Group:  Engaged  Modes of Intervention:  Support  Summary of Progress/Problems:  Antonio Mccoy 04/20/2015, 12:53 PM

## 2015-04-20 NOTE — BHH Suicide Risk Assessment (Signed)
[facility]   Demographic Factors:  Male and Unemployed  Total Time spent with patient: 30 minutes  Musculoskeletal: Strength & Muscle Tone: within normal limits Gait & Station: normal Patient leans: N/A  Psychiatric Specialty Exam: Physical Exam  Nursing note and vitals reviewed.   Review of Systems  Musculoskeletal: Positive for joint pain.  All other systems reviewed and are negative.   Blood pressure 128/68, pulse 70, temperature 97.8 F (36.6 C), temperature source Oral, resp. rate 20, height 5\' 10"  (1.778 m), weight 88.905 kg (196 lb), SpO2 99 %.Body mass index is 28.12 kg/(m^2).  General Appearance: Casual  Eye Contact::  Good  Speech:  Clear and Coherent409  Volume:  Normal  Mood:  Euthymic  Affect:  Appropriate  Thought Process:  Goal Directed  Orientation:  Full (Time, Place, and Person)  Thought Content:  WDL  Suicidal Thoughts:  No  Homicidal Thoughts:  No  Memory:  Immediate;   Fair Recent;   Fair Remote;   Fair  Judgement:  Impaired  Insight:  Shallow  Psychomotor Activity:  Normal  Concentration:  Fair  Recall:  F[employer]Fair  Fund of Knowledge:Fair  Language: Fair  Akathisia:  No  Handed:  Right  AIMS (if indicated):     Assets:  Communication Skills Desire for Improvement Housing Intimacy Physical Health Resilience Social Support  Sleep:  Number of Hours: 4.75  Cognition: WNL  ADL's:  Intact   Have you used any form of tobacco in the last 30 days? (Cigarettes, Smokeless Tobacco, Cigars, and/or Pipes): Yes  Has this patient used any form of tobacco in the last 30 days? (Cigarettes, Smokeless Tobacco, Cigars, and/or Pipes) Yes, A prescription for an FDA-approved tobacco cessation medication was offered at discharge and the patient refused  Mental Status Per Nursing Assessment::   On Admission:  NA  Current Mental Status by Physician: NA  Loss Factors: NA  Historical Factors: Impulsivity  Risk Reduction Factors:    Sense of responsibility to family, Living with another person, especially a relative, Positive social support and Positive therapeutic relationship  Continued Clinical Symptoms:  Alcohol/Substance Abuse/Dependencies Schizophrenia:   Less than 38 years old Paranoid or undifferentiated type  Cognitive Features That Contribute To Risk:  None    Suicide Risk:  Minimal: No identifiable suicidal ideation.  Patients presenting with no risk factors but with morbid ruminations; may be classified as minimal risk based on the severity of the depressive symptoms  Principal Problem: Undifferentiated schizophrenia [facility](HCC) Discharge Diagnoses:  Patient Active Problem List   Diagnosis Date Noted  . Alcohol use disorder, severe, dependence (HCC) [F10.20] 04/19/2015  . Alcohol withdrawal (HCC) [F10.239] 04/19/2015  . Tobacco use disorder [F17.200] 04/19/2015  . Undifferentiated schizophrenia (HCC) [F20.3]   . Ankle fracture [S82.899A] 02/02/2015      Plan Of Care/Follow-up recommendations:  Activity:  As tolerated. Diet:  Low sodium heart healthy. Other:  Keep follow-up appointments.  Is patient on multiple antipsychotic therapies at discharge:  No   Has Patient had three or more failed trials of antipsychotic monotherapy by history:  No  Recommended Plan for Multiple Antipsychotic Therapies: NA    Antonio Mccoy 04/20/2015, 11:08 AM

## 2015-04-20 NOTE — BHH Counselor (Signed)
Adult Comprehensive Assessment  Patient ID: Antonio Mccoy, male   DOB: July 03, 1975, 40 y.o.   MRN: [id number]  Information Source: Information source: Patient  Current Stressors:  Substance abuse: alcohol intoxication  Living/Environment/Situation:  Living Arrangements: Spouse/significant other  Family History:  Marital status: Long term relationship What types of issues is patient dealing with in the relationship?: none  Childhood History:     Education:  Highest grade of school patient has completed: 12th Currently a student?: No Name of school: N/A  Employment/Work Situation:   Employment situation: On disability Why is patient on disability: mental health How long has patient been on disability: 6 years Patient's job has been impacted by current illness: Yes Describe how patient's job has been impacted: not able to work a regular job Has patient ever been in the [employer]?: No Has patient ever served in combat?: No Did You Receive Any Psychiatric Treatment/Services While in Equities trader?: No Are There Guns or Education officer, community in Your Home?: No Are These Comptroller?:  (n/a)  Financial Resources:   Financial resources: Insurance claims handler Does patient have a Lawyer or guardian?: No  Alcohol/Substance Abuse:   What has been your use of drugs/alcohol within the last 12 months?: alcohol  Social Support System:   [employer] Support System: Fair Museum/gallery exhibitions officer System: family  Leisure/Recreation:      Strengths/Needs:      Discharge Plan:   Does patient have access to transportation?: Yes Will patient be returning to same living situation after discharge?: Yes Currently receiving community mental health services: No If no, would patient like referral for services when discharged?: Yes (What county?) ([facility] [place]) Does patient have financial barriers related to discharge medications?: Yes Patient description of  barriers related to discharge medications: no insurance and will be [facility]  Summary/Recommendations:  Patient is a 40 yo African-American male admitted for bizarre behavior and was intoxicated with BAC 266. Patient lives with Antonio Mccoy (girlfriend) [phone] who reports patient does best on injectable and does not like to take his medications. Patient is on disability receiving $900 monthly and can return home once stabilized on medications. Patient is referred to [facility] [phone] and will also be referred to [facility] [phone] for pharmacy services as patient is uninsured. Patient is encouraged to participate in medication management, group therapy, and therapeutic milieu while inpatient at [facility].     Lulu Riding., MSW, Theresia Majors [phone]  04/20/2015

## 2015-04-20 NOTE — Tx Team (Signed)
Interdisciplinary Treatment Plan Update (Adult)  Date:  04/20/2015 Time Reviewed:  1:49 PM  Progress in Treatment: Attending groups: Yes. Participating in groups:  Yes. Taking medication as prescribed:  Yes. Tolerating medication:  Yes. Family/Significant othe contact made:  Yes, individual(s) contacted:  Elizabeth "Angel" Blodgett (girlfriend) [phone] Patient understands diagnosis:  Yes. Discussing patient identified problems/goals with staff:  Yes. Medical problems stabilized or resolved:  Yes. Denies suicidal/homicidal ideation: Yes. Issues/concerns per patient self-inventory:  No. Other:  New problem(s) identified: No, Describe:  none reported  Discharge Plan or Barriers:Patient will stabilize on medications and discharge home with his girlfriend. Patient has follow up scheduled with [facility] [place] and is referred to [facility] for pharmacy  Reason for Continuation of Hospitalization: Aggression Withdrawal symptoms  Comments:  Estimated length of stay: 0 days will discharge today  New goal(s):  Review of initial/current patient goals per problem list:   1.  Goal(s):participate in aftercare planning  Met:  Yes  Target date:by discharge  As evidenced by:patient participation in aftercare planning  2.  Goal (s):decrease aggressive behavior  Met:  Yes  Target date:by discharge  As evidenced by:patient demonstrating decreased signs of aggression   Attendees: Physician:  Jolanta Pucilowska, MD 1/16/20171:49 PM  Nursing:   Andrea Breedlove, RN 1/16/20171:49 PM  Other:   , LCSWA 1/16/20171:49 PM  Other:   1/16/20171:49 PM  Other:   1/16/20171:49 PM  Other:  1/16/20171:49 PM  Other:  1/16/20171:49 PM  Other:  1/16/20171:49 PM  Other:  1/16/20171:49 PM  Other:  1/16/20171:49 PM  Other:  1/16/20171:49 PM  Other:   1/16/20171:49 PM   Scribe for Treatment Team:   ,  T, MSW, LCSWA [phone]   04/20/2015, 1:49  PM  

## 2015-04-20 NOTE — Progress Notes (Signed)
  [facility] :  Will you be returning to the same living situation after discharge:  Yes,  home with girlfriend At discharge, do you have transportation home?: Yes,  girlfriend will pick up Do you have the ability to pay for your medications: No. Patient is referred to [facility] [phone]  Release of information consent forms completed and in the chart;  Patient's signature needed at discharge.  Patient to Follow up at: Follow-up Information    Follow up with [facility]. Go on 04/22/2015.   Why:  For follow-up care appt Wednesday 04/22/15 at 7:00am (walk in appts MWF 8-3)   Contact information:   [address] [place], [place] [address] Ph [phone] Fax [phone] Lorella NimrodHarvey [phone]      Next level of care provider has [facility] to [facility] Link:no  Safety Planning and Suicide Prevention discussed: Yes,  SPE discussed with patient and Lanora Manislizabeth "Bunnie Dominongel" Deren (girlfriend) [phone]  Have you used any form of tobacco in the last 30 days? (Cigarettes, Smokeless Tobacco, Cigars, and/or Pipes): Yes  Has patient been referred to the Quitline?: Patient refused referral  Patient has been referred for addiction treatment: Yes, [facility]  Lulu Ridingngle, Mahesh Sizemore T, MSW, LCSWA [phone] 04/20/2015, 1:54 PM

## 2015-04-20 NOTE — BHH Suicide Risk Assessment (Signed)
BHH INPATIENT:  Family/Significant Other Suicide Prevention Education  Suicide Prevention Education:  Education Completed; Lanora Manislizabeth "Bunnie Dominongel" Rhoda (girlfriend) [phone] has been identified by the patient as the family member/significant other with whom the patient will be residing, and identified as the person(s) who will aid the patient in the event of a mental health crisis (suicidal ideations/suicide attempt).  With written consent from the patient, the family member/significant other has been provided the following suicide prevention education, prior to the and/or following the discharge of the patient.  The suicide prevention education provided includes the following:  Suicide risk factors  Suicide prevention and interventions  National Suicide Hotline telephone number  [facility] assessment telephone number  [facility] Emergency Assistance 911  [facility] and/or Residential Mobile Crisis Unit telephone number  Request made of family/significant other to:  Remove weapons (e.g., guns, rifles, knives), all items previously/currently identified as safety concern.    Remove drugs/medications (over-the-counter, prescriptions, illicit drugs), all items previously/currently identified as a safety concern.  The family member/significant other verbalizes understanding of the suicide prevention education information provided.  The family member/significant other agrees to remove the items of safety concern listed above.  Lulu Ridingngle, Veena Sturgess T, MSW, Theresia MajorsLCSWA [phone] 04/20/2015, 1:42 PM

## 2015-04-21 LAB — PROLACTIN: Prolactin: 0.3 ng/mL — ABNORMAL LOW (ref 4.0–15.2)

## 2015-04-21 MED ORDER — DIVALPROEX SODIUM ER 500 MG PO TB24: 500.0000 mg | ORAL_TABLET | Freq: Two times a day (BID) | ORAL | Status: DC

## 2015-04-21 MED ORDER — TRAZODONE HCL 150 MG PO TABS: 150.0000 mg | ORAL_TABLET | Freq: Every day | ORAL | Status: DC

## 2015-05-06 ENCOUNTER — Ambulatory Visit: Payer: No Typology Code available for payment source

## 2016-02-27 ENCOUNTER — Emergency Department
Admission: EM | Admit: 2016-02-27 | Discharge: 2016-02-27 | Disposition: A | Discharge status: Home / Self Care | Payer: No Typology Code available for payment source | Attending: Emergency Medicine | Admitting: Emergency Medicine

## 2016-02-27 ENCOUNTER — Encounter: Payer: Self-pay | Admitting: Emergency Medicine

## 2016-02-27 DIAGNOSIS — F17200 Nicotine dependence, unspecified, uncomplicated: Secondary | ICD-10-CM | POA: Insufficient documentation

## 2016-02-27 DIAGNOSIS — R4689 Other symptoms and signs involving appearance and behavior: Secondary | ICD-10-CM

## 2016-02-27 DIAGNOSIS — F209 Schizophrenia, unspecified: Secondary | ICD-10-CM | POA: Insufficient documentation

## 2016-02-27 DIAGNOSIS — Z79899 Other long term (current) drug therapy: Secondary | ICD-10-CM | POA: Insufficient documentation

## 2016-02-27 HISTORY — DX: Schizoaffective disorder, unspecified: F25.9

## 2016-02-27 HISTORY — DX: Schizoaffective disorder, bipolar type: F25.0

## 2016-02-27 LAB — URINE DRUG SCREEN, QUALITATIVE (ARMC ONLY)
Amphetamines, Ur Screen: NOT DETECTED
Barbiturates, Ur Screen: NOT DETECTED
Benzodiazepine, Ur Scrn: NOT DETECTED
CANNABINOID 50 NG, UR ~~LOC~~: NOT DETECTED
COCAINE METABOLITE, UR ~~LOC~~: NOT DETECTED
MDMA (ECSTASY) UR SCREEN: NOT DETECTED
Methadone Scn, Ur: NOT DETECTED
Opiate, Ur Screen: NOT DETECTED
PHENCYCLIDINE (PCP) UR S: NOT DETECTED
Tricyclic, Ur Screen: NOT DETECTED

## 2016-02-27 LAB — COMPREHENSIVE METABOLIC PANEL
ALBUMIN: 4.7 g/dL (ref 3.5–5.0)
ALK PHOS: 94 U/L (ref 38–126)
ALT: 27 U/L (ref 17–63)
ANION GAP: 14 (ref 5–15)
AST: 51 U/L — AB (ref 15–41)
BUN: 8 mg/dL (ref 6–20)
CALCIUM: 9.2 mg/dL (ref 8.9–10.3)
CO2: 22 mmol/L (ref 22–32)
Chloride: 103 mmol/L (ref 101–111)
Creatinine, Ser: 1.1 mg/dL (ref 0.61–1.24)
GFR calc Af Amer: >60 mL/min (ref >60)
GFR calc non Af Amer: >60 mL/min (ref >60)
GLUCOSE: 87 mg/dL (ref 65–99)
Potassium: 3.6 mmol/L (ref 3.5–5.1)
SODIUM: 139 mmol/L (ref 135–145)
Total Bilirubin: 0.6 mg/dL (ref 0.3–1.2)
Total Protein: 8.3 g/dL — ABNORMAL HIGH (ref 6.5–8.1)

## 2016-02-27 LAB — CBC
HCT: 52.9 % — ABNORMAL HIGH (ref 40.0–52.0)
Hemoglobin: 18.5 g/dL — ABNORMAL HIGH (ref 13.0–18.0)
MCH: 34.3 pg — AB (ref 26.0–34.0)
MCHC: 35 g/dL (ref 32.0–36.0)
MCV: 98.1 fL (ref 80.0–100.0)
PLATELETS: 243 10*3/uL (ref 150–440)
RBC: 5.39 MIL/uL (ref 4.40–5.90)
RDW: 15.3 % — ABNORMAL HIGH (ref 11.5–14.5)
WBC: 7.5 10*3/uL (ref 3.8–10.6)

## 2016-02-27 LAB — ETHANOL: Alcohol, Ethyl (B): 200 mg/dL — ABNORMAL HIGH (ref <5)

## 2016-02-27 NOTE — BH Assessment (Signed)
Writer attempted to assess the patient.  Patient refused to speak and was not able to be aroused.   Patient BAL is 200.
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
## 2016-02-27 NOTE — ED Notes (Signed)
NAD noted at this time. Pt is resting in bed with eyes closed, respirations even and unlabored. Will continue to monitor for further patient needs.

## 2016-02-27 NOTE — ED Notes (Signed)
BEHAVIORAL HEALTH ROUNDING  Patient sleeping: No.  Patient alert and oriented: yes  Behavior appropriate: Yes. ; If no, describe:  Nutrition and fluids offered: Yes  Toileting and hygiene offered: Yes  Sitter present: not applicable, Q 15 min safety rounds and observation.  Law enforcement present: Yes ODS  

## 2016-02-27 NOTE — ED Notes (Signed)
Pt given food tray at this time

## 2016-02-27 NOTE — Discharge Instructions (Signed)
You have been seen in the emergency department for a  psychiatric concern. You have been evaluated both medically as well as psychiatrically. Please follow-up with your outpatient resources provided. Return to the emergency department for any worsening symptoms, or any thoughts of hurting yourself or anyone else so that we may attempt to help you. 

## 2016-02-27 NOTE — ED Notes (Signed)
IVC discontinued. Pt informed and attempting to call for a ride.
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
## 2016-02-27 NOTE — ED Notes (Signed)

## 2016-02-27 NOTE — ED Notes (Signed)
[facility] Recommends discharge and reversal of IVC. Dr Lenard LancePaduchowski aware.

## 2016-02-27 NOTE — ED Notes (Signed)
[facility]Bowyer [facility]. Computer turned on and pt speaking with MD.
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
## 2016-02-27 NOTE — ED Notes (Signed)
Report given to Ann, RN.
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
## 2017-02-22 ENCOUNTER — Emergency Department
Admission: EM | Admit: 2017-02-22 | Discharge: 2017-02-22 | Disposition: A | Discharge status: Home / Self Care | Payer: No Typology Code available for payment source | Attending: Emergency Medicine | Admitting: Emergency Medicine

## 2017-02-22 ENCOUNTER — Encounter: Payer: Self-pay | Admitting: Emergency Medicine

## 2017-02-22 DIAGNOSIS — Z711 Person with feared health complaint in whom no diagnosis is made: Secondary | ICD-10-CM

## 2017-02-22 DIAGNOSIS — F17200 Nicotine dependence, unspecified, uncomplicated: Secondary | ICD-10-CM | POA: Insufficient documentation

## 2017-02-22 NOTE — ED Notes (Signed)
Patient states that he thinks that he has a bug in his left ear. Patient states that he started having a buzzing in his left ear that started about 30 minutes ago.

## 2017-02-22 NOTE — ED Provider Notes (Signed)
[facility] Emergency Department Provider Note  ____________________________________________  Time seen: Approximately 10:53 PM  I have reviewed the triage vital signs and the nursing notes.   HISTORY  Chief Complaint Foreign Body in Ear    HPI Antonio Mccoy is a 41 y.o. male who presents the emergency department complaining of possible foreign body to the left ear.  Patient reports that he believes he felt something crawl into his ear and heard a buzzing sound.  Patient reports that this is no longer present but wants to be checked for possible foreign body to the ear.  Patient denies any ear pain, loss of hearing.  No other symptoms at this time.  No medications prior to arrival.  Past Medical History:  Diagnosis Date  . Schizo affective schizophrenia [facility](HCC)     Patient Active Problem List   Diagnosis Date Noted  . Alcohol use disorder, severe, dependence (HCC) 04/19/2015  . Alcohol withdrawal (HCC) 04/19/2015  . Tobacco use disorder 04/19/2015  . Undifferentiated schizophrenia (HCC)   . Ankle fracture 02/02/2015    Past Surgical History:  Procedure Laterality Date  . ORIF ANKLE FRACTURE Left 02/02/2015   Procedure: OPEN REDUCTION INTERNAL FIXATION (ORIF) ANKLE FRACTURE;  Surgeon: Christena FlakeJohn J Poggi, MD;  Location: [facility] ORS;  Service: Orthopedics;  Laterality: Left;  . SMALL INTESTINE SURGERY      Prior to Admission medications   Medication Sig Start Date End Date Taking? Authorizing Provider  benztropine (COGENTIN) 1 MG tablet Take 1 tablet (1 mg total) by mouth 2 (two) times daily. Patient not taking: Reported on 02/27/2016 04/20/15   Pucilowska, Braulio ConteJolanta B, MD  divalproex (DEPAKOTE ER) 500 MG 24 hr tablet Take 1 tablet (500 mg total) by mouth 2 (two) times daily. Patient not taking: Reported on 02/27/2016 04/21/15   Pucilowska, Braulio ConteJolanta B, MD  haloperidol (HALDOL) 5 MG tablet Take 1 tablet (5 mg total) by mouth 2 (two) times daily. Patient not  taking: Reported on 02/27/2016 04/20/15   Pucilowska, Ellin GoodieJolanta B, MD  traZODone (DESYREL) 150 MG tablet Take 1 tablet (150 mg total) by mouth at bedtime. Patient not taking: Reported on 02/27/2016 04/21/15   Shari ProwsPucilowska, Jolanta B, MD    Allergies Patient has no known allergies.  No family history on file.  Social History Social History   Tobacco Use  . Smoking status: Current Every Day Smoker    Packs/day: 1.00  . Smokeless tobacco: Never Used  Substance Use Topics  . Alcohol use: Yes  . Drug use: No     Review of Systems  Constitutional: No fever/chills Eyes: No visual changes. No discharge ENT: Possible foreign body to the left ear Cardiovascular: no chest pain. Respiratory: no cough. No SOB. Gastrointestinal: No abdominal pain.  No nausea, no vomiting.   Musculoskeletal: Negative for musculoskeletal pain. Skin: Negative for rash, abrasions, lacerations, ecchymosis. Neurological: Negative for headaches, focal weakness or numbness. 10-point ROS otherwise negative.  ____________________________________________   PHYSICAL EXAM:  VITAL SIGNS: ED Triage Vitals [02/22/17 2125]  Enc Vitals Group     BP 138/68     Pulse Rate 87     Resp 17     Temp 98.5 F (36.9 C)     Temp Source Oral     SpO2 97 %     Weight 200 lb (90.7 kg)     Height 5\' 9"  (1.753 m)     Head Circumference      Peak Flow      Pain  Score 5     Pain Loc      Pain Edu?      Excl. in GC?      Constitutional: Alert and oriented. Well appearing and in no acute distress. Eyes: Conjunctivae are normal. PERRL. EOMI. Head: Atraumatic. ENT:      Ears: EAC and TM unremarkable bilaterally.  Visualization along the left EAC are abrasion, foreign body, residual insect.  No significant cerumen.      Nose: No congestion/rhinnorhea.      Mouth/Throat: Mucous membranes are moist.  Neck: No stridor.    Cardiovascular: Normal rate, regular rhythm. Normal S1 and S2.  Good peripheral circulation. Respiratory:  Normal respiratory effort without tachypnea or retractions. Lungs CTAB. Good air entry to the bases with no decreased or absent breath sounds. Musculoskeletal: Full range of motion to all extremities. No gross deformities appreciated. Neurologic:  Normal speech and language. No gross focal neurologic deficits are appreciated.  Skin:  Skin is warm, dry and intact. No rash noted. Psychiatric: Mood and affect are normal. Speech and behavior are normal. Patient exhibits appropriate insight and judgement.   ____________________________________________   LABS (all labs ordered are listed, but only abnormal results are displayed)  Labs Reviewed - No data to display ____________________________________________  EKG   ____________________________________________  RADIOLOGY   No results found.  ____________________________________________    PROCEDURES  Procedure(s) performed:    Procedures    Medications - No data to display   ____________________________________________   INITIAL IMPRESSION / ASSESSMENT AND PLAN / ED COURSE  Pertinent labs & imaging results that were available during my care of the patient were reviewed by me and considered in my medical decision making (see chart for details).  Review of the [place] CSRS was performed in accordance of the [facility] prior to dispensing any controlled drugs.     Patient's diagnosis is consistent with feared complaint without diagnosis.  Patient was concerned that he may have a foreign body in the left ear.  Visualization reveals no foreign body.  Differential included foreign body, infectious process.  Patient requests that I flushed the area out even after visualization revealed no insect.  This was accomplished with no foreign body.  Patient will follow up with primary care as needed. patient is given ED precautions to return to the ED for any worsening or new  symptoms.     ____________________________________________  FINAL CLINICAL IMPRESSION(S) / ED DIAGNOSES  Final diagnoses:  None      NEW MEDICATIONS STARTED DURING THIS VISIT:  ED Discharge Orders    None          This chart was dictated using voice recognition software/Dragon. Despite best efforts to proofread, errors can occur which can change the meaning. Any change was purely unintentional.    Racheal PatchesCuthriell, Ender Rorke D, PA-C 02/22/17 2259    Dionne BucySiadecki, Sebastian, MD 02/22/17 2315

## 2017-02-22 NOTE — ED Triage Notes (Signed)
Pt comes into the ED via POV c/o something in his left ear.  Patient believes it may be a bug because he can hear a "buzzing" sound.  Patient in NAD at this time.  Denies any other pain, denies any cough, cold, or congestion.

## 2017-12-09 ENCOUNTER — Other Ambulatory Visit: Payer: Self-pay

## 2017-12-09 ENCOUNTER — Emergency Department
Admission: EM | Admit: 2017-12-09 | Discharge: 2017-12-10 | Disposition: A | Discharge status: Home / Self Care | Payer: Self-pay | Attending: Emergency Medicine | Admitting: Emergency Medicine

## 2017-12-09 DIAGNOSIS — F29 Unspecified psychosis not due to a substance or known physiological condition: Secondary | ICD-10-CM | POA: Insufficient documentation

## 2017-12-09 DIAGNOSIS — F203 Undifferentiated schizophrenia: Secondary | ICD-10-CM | POA: Diagnosis present

## 2017-12-09 DIAGNOSIS — F17200 Nicotine dependence, unspecified, uncomplicated: Secondary | ICD-10-CM | POA: Insufficient documentation

## 2017-12-09 DIAGNOSIS — F1020 Alcohol dependence, uncomplicated: Secondary | ICD-10-CM | POA: Diagnosis present

## 2017-12-09 LAB — URINE DRUG SCREEN, QUALITATIVE (ARMC ONLY)
Amphetamines, Ur Screen: NOT DETECTED
BARBITURATES, UR SCREEN: NOT DETECTED
BENZODIAZEPINE, UR SCRN: NOT DETECTED
CANNABINOID 50 NG, UR ~~LOC~~: NOT DETECTED
Cocaine Metabolite,Ur ~~LOC~~: NOT DETECTED
MDMA (Ecstasy)Ur Screen: NOT DETECTED
Methadone Scn, Ur: NOT DETECTED
Opiate, Ur Screen: NOT DETECTED
PHENCYCLIDINE (PCP) UR S: NOT DETECTED
Tricyclic, Ur Screen: NOT DETECTED

## 2017-12-09 LAB — URINALYSIS, COMPLETE (UACMP) WITH MICROSCOPIC
BILIRUBIN URINE: NEGATIVE
Bacteria, UA: NONE SEEN
GLUCOSE, UA: NEGATIVE mg/dL
HGB URINE DIPSTICK: NEGATIVE
Ketones, ur: NEGATIVE mg/dL
Leukocytes, UA: NEGATIVE
Nitrite: NEGATIVE
Protein, ur: NEGATIVE mg/dL
SPECIFIC GRAVITY, URINE: 1.003 — AB (ref 1.005–1.030)
Squamous Epithelial / LPF: NONE SEEN (ref 0–5)
pH: 6 (ref 5.0–8.0)

## 2017-12-09 LAB — CBC
HCT: 47.7 % (ref 40.0–52.0)
Hemoglobin: 16.7 g/dL (ref 13.0–18.0)
MCH: 33 pg (ref 26.0–34.0)
MCHC: 34.9 g/dL (ref 32.0–36.0)
MCV: 94.4 fL (ref 80.0–100.0)
PLATELETS: 223 10*3/uL (ref 150–440)
RBC: 5.06 MIL/uL (ref 4.40–5.90)
RDW: 13.8 % (ref 11.5–14.5)
WBC: 6.9 10*3/uL (ref 3.8–10.6)

## 2017-12-09 LAB — COMPREHENSIVE METABOLIC PANEL
ALBUMIN: 4.3 g/dL (ref 3.5–5.0)
ALK PHOS: 105 U/L (ref 38–126)
ALT: 38 U/L (ref 0–44)
AST: 72 U/L — ABNORMAL HIGH (ref 15–41)
Anion gap: 11 (ref 5–15)
BILIRUBIN TOTAL: 0.6 mg/dL (ref 0.3–1.2)
BUN: 7 mg/dL (ref 6–20)
CALCIUM: 8.3 mg/dL — AB (ref 8.9–10.3)
CO2: 25 mmol/L (ref 22–32)
CREATININE: 1.16 mg/dL (ref 0.61–1.24)
Chloride: 105 mmol/L (ref 98–111)
GFR calc non Af Amer: >60 mL/min (ref >60)
GLUCOSE: 104 mg/dL — AB (ref 70–99)
Potassium: 3.6 mmol/L (ref 3.5–5.1)
SODIUM: 141 mmol/L (ref 135–145)
Total Protein: 7.7 g/dL (ref 6.5–8.1)

## 2017-12-09 LAB — ETHANOL: Alcohol, Ethyl (B): 216 mg/dL — ABNORMAL HIGH (ref <10)

## 2017-12-09 MED ORDER — ZIPRASIDONE MESYLATE 20 MG IM SOLR
INTRAMUSCULAR | Status: AC
  Administered 2017-12-09: 20 mg via INTRAMUSCULAR
  Filled 2017-12-09: qty 20

## 2017-12-09 MED ORDER — BENZTROPINE MESYLATE 1 MG/ML IJ SOLN
1.0000 mg | Freq: Two times a day (BID) | INTRAMUSCULAR | Status: DC
  Filled 2017-12-09: qty 1

## 2017-12-09 MED ORDER — HALOPERIDOL 5 MG PO TABS
5.0000 mg | ORAL_TABLET | Freq: Two times a day (BID) | ORAL | Status: DC
  Administered 2017-12-09 – 2017-12-10 (×2): 5 mg via ORAL
  Filled 2017-12-09 (×2): qty 1

## 2017-12-09 MED ORDER — DIVALPROEX SODIUM 500 MG PO DR TAB
500.0000 mg | DELAYED_RELEASE_TABLET | Freq: Two times a day (BID) | ORAL | Status: DC
  Administered 2017-12-09 – 2017-12-10 (×2): 500 mg via ORAL
  Filled 2017-12-09 (×2): qty 1

## 2017-12-09 MED ORDER — TRAZODONE HCL 50 MG PO TABS
150.0000 mg | ORAL_TABLET | Freq: Every evening | ORAL | Status: DC | PRN
  Administered 2017-12-09: 150 mg via ORAL
  Filled 2017-12-09: qty 1

## 2017-12-09 MED ORDER — ZIPRASIDONE MESYLATE 20 MG IM SOLR
20.0000 mg | Freq: Once | INTRAMUSCULAR | Status: AC
  Administered 2017-12-09: 20 mg via INTRAMUSCULAR

## 2017-12-09 NOTE — ED Notes (Signed)
BEHAVIORAL HEALTH ROUNDING Patient sleeping: Yes.   Patient alert and oriented: eyes closed  Appears to be asleep Behavior appropriate: Yes.  ; If no, describe:  Nutrition and fluids offered: Yes  Toileting and hygiene offered: sleeping Sitter present: q 15 minute observations and security monitoring Law enforcement present: yes  ODS 

## 2017-12-09 NOTE — ED Notes (Signed)
Dinner meal given to patient. 

## 2017-12-09 NOTE — ED Notes (Signed)
Patient received PM snack. 

## 2017-12-09 NOTE — ED Notes (Signed)
ED BHU PLACEMENT JUSTIFICATION Is the patient under IVC or is there intent for IVC: Yes.   Is the patient medically cleared: Yes.   Is there vacancy in the ED BHU: Yes.   Is the population mix appropriate for patient: Yes.   Is the patient awaiting placement in inpatient or outpatient setting: Yes.   Has the patient had a psychiatric consult: Yes.   Survey of unit performed for contraband, proper placement and condition of furniture, tampering with fixtures in bathroom, shower, and each patient room: Yes.   APPEARANCE/BEHAVIOR adequate rapport can be established NEURO ASSESSMENT Orientation: time, place and person Hallucinations: No.None noted (Hallucinations) Speech: Normal Gait: normal RESPIRATORY ASSESSMENT Normal expansion.  Clear to auscultation.  No rales, rhonchi, or wheezing. CARDIOVASCULAR ASSESSMENT regular rate and rhythm, S1, S2 normal, no murmur, click, rub or gallop GASTROINTESTINAL ASSESSMENT soft, nontender, BS WNL, no r/g EXTREMITIES normal strength, tone, and muscle mass PLAN OF CARE Provide calm/safe environment. Vital signs assessed twice daily. ED BHU Assessment once each 12-hour shift. Collaborate with intake RN daily or as condition indicates. Assure the ED provider has rounded once each shift. Provide and encourage hygiene. Provide redirection as needed. Assess for escalating behavior; address immediately and inform ED provider.  Assess family dynamic and appropriateness for visitation as needed: Yes.   Educate the patient/family about BHU procedures/visitation: Yes.   

## 2017-12-09 NOTE — ED Notes (Signed)

## 2017-12-09 NOTE — ED Notes (Signed)
Pt IVCd 

## 2017-12-09 NOTE — ED Notes (Signed)
BEHAVIORAL HEALTH ROUNDING Patient sleeping: No. Patient alert and oriented: yes Behavior appropriate: Yes.  ; If no, describe:  Nutrition and fluids offered: yes Toileting and hygiene offered: Yes  Sitter present: q15 minute observations and security  monitoring Law enforcement present: Yes  ODS  

## 2017-12-09 NOTE — ED Provider Notes (Signed)
[facility] Emergency Department Provider Note ____________________________________________   I have reviewed the triage vital signs and the triage nursing note.  HISTORY  Chief Complaint Aggressive Behavior   Historian Level 5 Caveat History Limited by altered mental status, uncooperative, aggressive, poor historian Arrives in handcuffs with police after IVC called due to family member  HPI Antonio Mccoy is a 42 y.o. male with a history of schizoaffective/schizophrenia, as well as alcohol abuse, reportedly off of his psychiatric medications noted as "bipolar medications "on involuntary commitment paperwork that was taken out by family member.  Please brought the patient in as he was aggressive and dangerous he was arrived in handcuffs.  Patient states he does not know why he was brought here.  He is cursing.     Past Medical History:  Diagnosis Date  . Schizo affective schizophrenia [facility])     Patient Active Problem List   Diagnosis Date Noted  . Alcohol use disorder, severe, dependence (HCC) 04/19/2015  . Alcohol withdrawal (HCC) 04/19/2015  . Tobacco use disorder 04/19/2015  . Undifferentiated schizophrenia (HCC)   . Ankle fracture 02/02/2015    Past Surgical History:  Procedure Laterality Date  . ORIF ANKLE FRACTURE Left 02/02/2015   Procedure: OPEN REDUCTION INTERNAL FIXATION (ORIF) ANKLE FRACTURE;  Surgeon: Christena Flake, MD;  Location: [facility] ORS;  Service: Orthopedics;  Laterality: Left;  . SMALL INTESTINE SURGERY      Prior to Admission medications   Medication Sig Start Date End Date Taking? Authorizing Provider  benztropine (COGENTIN) 1 MG tablet Take 1 tablet (1 mg total) by mouth 2 (two) times daily. Patient not taking: Reported on 02/27/2016 04/20/15   Pucilowska, Braulio Conte B, MD  divalproex (DEPAKOTE ER) 500 MG 24 hr tablet Take 1 tablet (500 mg total) by mouth 2 (two) times daily. Patient not taking: Reported on 02/27/2016 04/21/15    Pucilowska, Braulio Conte B, MD  haloperidol (HALDOL) 5 MG tablet Take 1 tablet (5 mg total) by mouth 2 (two) times daily. Patient not taking: Reported on 02/27/2016 04/20/15   Pucilowska, Ellin Goodie, MD  traZODone (DESYREL) 150 MG tablet Take 1 tablet (150 mg total) by mouth at bedtime. Patient not taking: Reported on 02/27/2016 04/21/15   Shari Prows, MD    No Known Allergies  No family history on file.  Social History Social History   Tobacco Use  . Smoking status: Current Every Day Smoker    Packs/day: 1.00  . Smokeless tobacco: Never Used  Substance Use Topics  . Alcohol use: Yes  . Drug use: No    Review of Systems Limited due to patient altered mental status, but denies chest pain trouble breathing abdominal pain.  ____________________________________________   PHYSICAL EXAM:  VITAL SIGNS: ED Triage Vitals  Enc Vitals Group     BP 12/09/17 1123 (!) 160/123     Pulse Rate 12/09/17 1123 (!) 122     Resp 12/09/17 1123 18     Temp 12/09/17 1123 98.5 F (36.9 C)     Temp Source 12/09/17 1123 Oral     SpO2 12/09/17 1123 94 %     Weight 12/09/17 1124 230 lb (104.3 kg)     Height 12/09/17 1124 5\' 9"  (1.753 m)     Head Circumference --      Peak Flow --      Pain Score 12/09/17 1133 0     Pain Loc --      Pain Edu? --  Excl. in GC? --      Constitutional: Alert and agitated.  Extremely labile.  Cursing. HEENT      Head: Normocephalic and atraumatic.      Eyes: Conjunctivae are injected bilaterally. Pupils equal and round.       Ears:         Nose: No congestion/rhinnorhea.      Mouth/Throat: Mucous membranes are moist.  Breath smells of alcohol      Neck: No stridor. Cardiovascular/Chest: Normal rate, regular rhythm.  No murmurs, rubs, or gallops. Respiratory: Normal respiratory effort without tachypnea nor retractions. Breath sounds are clear and equal bilaterally. No wheezes/rales/rhonchi. Gastrointestinal: Soft. No distention, no guarding, no  rebound. Nontender.  Genitourinary/rectal:Deferred Musculoskeletal: Nontender with normal range of motion in all extremities. No joint effusions.  No lower extremity tenderness.  No edema. Neurologic: No slurred speech.  No facial droop.  Moving 4 extremities. Skin:  Skin is warm, dry and intact. No rash noted. Psychiatric: Agitated and labile mood.  Denies suicidal thoughts.  Poor insight and judgment   ____________________________________________  LABS (pertinent positives/negatives) I, Governor Rooks, MD the attending physician have reviewed the labs noted below.  Labs Reviewed  COMPREHENSIVE METABOLIC PANEL - Abnormal; Notable for the following components:      Result Value   Glucose, Bld 104 (*)    Calcium 8.3 (*)    AST 72 (*)    All other components within normal limits  ETHANOL - Abnormal; Notable for the following components:   Alcohol, Ethyl (B) 216 (*)    All other components within normal limits  CBC  URINE DRUG SCREEN, QUALITATIVE ([facility] ONLY)  URINALYSIS, COMPLETE ([facility]) WITH MICROSCOPIC    ____________________________________________    EKG I, Governor Rooks, MD, the attending physician have personally viewed and interpreted all ECGs.  None ____________________________________________  RADIOLOGY   None __________________________________________  PROCEDURES  Procedure(s) performed: None  Procedures  Critical Care performed: CRITICAL CARE Performed by: Governor Rooks   Total critical care time: 30 minutes  Critical care time was exclusive of separately billable procedures and treating other patients.  Critical care was necessary to treat or prevent imminent or life-threatening deterioration.  Critical care was time spent personally by me on the following activities: development of treatment plan with patient and/or surrogate as well as nursing, discussions with consultants, evaluation of patient's response to treatment, examination of patient,  obtaining history from patient or surrogate, ordering and performing treatments and interventions, ordering and review of laboratory studies, ordering and review of radiographic studies, pulse oximetry and re-evaluation of patient's condition.    ____________________________________________  ED COURSE / ASSESSMENT AND PLAN  Pertinent labs & imaging results that were available during my care of the patient were reviewed by me and considered in my medical decision making (see chart for details).    Patient required bedside management to assess his dangerous labile mood and behavior.  Initially patient was agitated with police presence, but somewhat redirectable with my presence, however this was unable to last and patient required intramuscular Geodon to facilitate patient's and staff safety.   Given chart history of prior diagnosis of schizophrenia, suspect psychosis due to schizophrenia and medication noncompliance.  Patient care transferred to Dr. Mayford Knife at shift change 3 PM.   CONSULTATIONS:   TTS and specialist on-call psychiatry were placed.   Patient / Family / Caregiver informed of clinical course, medical decision-making process, and agree with plan.    ___________________________________________   FINAL CLINICAL IMPRESSION(S) / ED  DIAGNOSES   Final diagnoses:  Psychosis, unspecified psychosis type (HCC)      ___________________________________________         Note: This dictation was prepared with Dragon dictation. Any transcriptional errors that result from this process are unintentional    Governor Rooks, MD 12/09/17 [phone]

## 2017-12-09 NOTE — ED Notes (Signed)
Pt verbalizing threats towards officers - handcuffs remain in place to his bilateral wrists  - IM injection ordered by physician  Pt able to calm and answer questions appropriately but then yells at officers     Black paints, tennis shoes, a pair of socks, black and white printed shirt, black colored round plastic earrings, a red plastic watch, a black watch, black do rag, newports with unknown amount inside and a green lighter  All items placed in one bag and labeled with his name

## 2017-12-09 NOTE — ED Triage Notes (Signed)
Pt's mother called 911, states that he had been acting up for the past 3 days and not taking his medication. Pt is currently in handcuffs with [employer], pt smells of etoh at this time, pt denies knowing why he is here, pt initially speaking in a threatening tone directed at officers, then changing his tone when office Kathlene November introduces himself

## 2017-12-09 NOTE — ED Notes (Signed)
Patient admitted to room [facility] 3. Patient drowsy and oriented. Patient oriented to unit. Patient denies SI/HI and A/V hallucinations. Patient calm and cooperative at this time. Patient provided support and encouragement. Q 15 minute checks in progress and patient remains safe on unit. Monitoring continues and patient remains safe on unit.

## 2017-12-10 DIAGNOSIS — F203 Undifferentiated schizophrenia: Secondary | ICD-10-CM

## 2017-12-10 MED ORDER — HALOPERIDOL DECANOATE 100 MG/ML IM SOLN
100.0000 mg | Freq: Once | INTRAMUSCULAR | Status: AC
  Administered 2017-12-10: 100 mg via INTRAMUSCULAR
  Filled 2017-12-10: qty 1

## 2017-12-10 NOTE — Consult Note (Signed)
[facility] Face-to-Face Psychiatry Consult   Reason for Consult: Consult for this 42 year old man with a history of schizophrenia and alcohol abuse.  Brought into the hospital under IVC with a claim that he was erratically walking in the street. Referring Physician: Siadecki Patient Identification: Antonio Mccoy MRN:  [id number] Principal Diagnosis: Undifferentiated schizophrenia [facility]) Diagnosis:   Patient Active Problem List   Diagnosis Date Noted  . Alcohol use disorder, severe, dependence ([place]) [F10.20] 04/19/2015  . Alcohol withdrawal ([place]) [F10.239] 04/19/2015  . Tobacco use disorder [F17.200] 04/19/2015  . Undifferentiated schizophrenia ([place]) [F20.3]   . Ankle fracture [S82.899A] 02/02/2015    Total Time spent with patient: 1 hour  Subjective:   Antonio Mccoy is a 42 y.o. male patient admitted with "somebody said I was zigzagging in the street, which makes no sense".  HPI: Patient seen chart reviewed.  Patient was brought in after somebody probably his girlfriend called [employer].  They allege that the patient was walking erratically in the street.  Patient denies this.  He says he feels like he was walking appropriately.  Denies that he was trying to get hurt or trying to kill himself.  He admits that he was drinking last night but minimizes it saying he only had "a beer".  Blood alcohol level was over 200 on presentation.  Apparently collateral history is that he has been up for a few days not taking his medicine and drinking more than usual.  There is secondhand allegation that he had made suicidal statements.  Patient denies having made suicidal statements denies any wish to die denies any wish to hurt anyone else.  Says he is not currently having any hallucinations.  He does admit that he has not gone to [facility] and "a minute" and probably is not taking his medicine regularly.  Social history: Patient says he has been living with his girlfriend but he thinks that she is upset  because he wants to leave her.  Plans to go back and live with some other friends for a while.  Not working.  Medical history: No significant ongoing medical issues  Substance abuse history: Established problem with alcohol abuse.  No history of seizures or DTs.  Patient's insight is partial.  Does not typically use other drugs.  Past Psychiatric History: Patient has a history of schizophrenia and has been stabilized in the past on antipsychotic medication.  Has a history of noncompliance.  Does have a history of some suicidal statements and some agitation and aggression when psychotic in the past.  Risk to Self: Suicidal Ideation: No Suicidal Intent: No Is patient at risk for suicide?: No, but patient needs Medical Clearance Suicidal Plan?: No Access to Means: No What has been your use of drugs/alcohol within the last 12 months?: pt reports to pt drinking alcohol daily  How many times?: 0 Other Self Harm Risks: substance abuse Triggers for Past Attempts: Spouse contact Intentional Self Injurious Behavior: None Risk to Others: Homicidal Ideation: No Thoughts of Harm to Others: No Current Homicidal Intent: No Current Homicidal Plan: No Access to Homicidal Means: No Identified Victim: None identified History of harm to others?: No Assessment of Violence: None Noted Violent Behavior Description: None noted Does patient have access to weapons?: No Criminal Charges Pending?: No Prior Inpatient Therapy: Prior Inpatient Therapy: Yes Prior Therapy Dates: 2017 Prior Therapy Facilty/Provider(s): [facility] Reason for Treatment: schizophrenia, SI Prior Outpatient Therapy: Prior Outpatient Therapy: No Does patient have an ACCT team?: No Does patient have Intensive  In-House Services?  : No Does patient have [facility] services? : No Does patient have P4CC services?: No  Past Medical History:  Past Medical History:  Diagnosis Date  . Schizo affective schizophrenia [facility])     Past Surgical  History:  Procedure Laterality Date  . ORIF ANKLE FRACTURE Left 02/02/2015   Procedure: OPEN REDUCTION INTERNAL FIXATION (ORIF) ANKLE FRACTURE;  Surgeon: Corky Mull, MD;  Location: [facility] ORS;  Service: Orthopedics;  Laterality: Left;  . SMALL INTESTINE SURGERY     Family History: No family history on file. Family Psychiatric  History: Denies any Social History:  Social History   Substance and Sexual Activity  Alcohol Use Yes     Social History   Substance and Sexual Activity  Drug Use No    Social History   Socioeconomic History  . Marital status: Single    Spouse name: Not on file  . Number of children: Not on file  . Years of education: Not on file  . Highest education level: Not on file  Occupational History  . Not on file  Social Needs  . Financial resource strain: Not on file  . Food insecurity:    Worry: Not on file    Inability: Not on file  . Transportation needs:    Medical: Not on file    Non-medical: Not on file  Tobacco Use  . Smoking status: Current Every Day Smoker    Packs/day: 1.00  . Smokeless tobacco: Never Used  Substance and Sexual Activity  . Alcohol use: Yes  . Drug use: No  . Sexual activity: Not on file  Lifestyle  . Physical activity:    Days per week: Not on file    Minutes per session: Not on file  . Stress: Not on file  Relationships  . Social connections:    Talks on phone: Not on file    Gets together: Not on file    Attends religious service: Not on file    Active member of club or organization: Not on file    Attends meetings of clubs or organizations: Not on file    Relationship status: Not on file  Other Topics Concern  . Not on file  Social History Narrative  . Not on file   Additional Social History:    Allergies:  No Known Allergies  Labs:  Results for orders placed or performed during the hospital encounter of 12/09/17 (from the past 48 hour(s))  Comprehensive metabolic panel     Status: Abnormal    Collection Time: 12/09/17 11:50 AM  Result Value Ref Range   Sodium 141 135 - 145 mmol/L   Potassium 3.6 3.5 - 5.1 mmol/L   Chloride 105 98 - 111 mmol/L   CO2 25 22 - 32 mmol/L   Glucose, Bld 104 (H) 70 - 99 mg/dL   BUN 7 6 - 20 mg/dL   Creatinine, Ser 1.16 0.61 - 1.24 mg/dL   Calcium 8.3 (L) 8.9 - 10.3 mg/dL   Total Protein 7.7 6.5 - 8.1 g/dL   Albumin 4.3 3.5 - 5.0 g/dL   AST 72 (H) 15 - 41 U/L   ALT 38 0 - 44 U/L   Alkaline Phosphatase 105 38 - 126 U/L   Total Bilirubin 0.6 0.3 - 1.2 mg/dL   GFR calc non Af Amer >60 >60 mL/min   GFR calc Af Amer >60 >60 mL/min    Comment: (NOTE) The eGFR has been calculated using the CKD EPI equation.  This calculation has not been validated in all clinical situations. eGFR's persistently <60 mL/min signify possible Chronic Kidney Disease.    Anion gap 11 5 - 15    Comment: Performed at [facility], [address] [place], [place] [address]  Ethanol     Status: Abnormal   Collection Time: 12/09/17 11:50 AM  Result Value Ref Range   Alcohol, Ethyl (B) 216 (H) <10 mg/dL    Comment: (NOTE) Lowest detectable limit for serum alcohol is 10 mg/dL. For medical purposes only. Performed at [facility], [address] [place], [place] [address]   cbc     Status: None   Collection Time: 12/09/17 11:50 AM  Result Value Ref Range   WBC 6.9 3.8 - 10.6 K/uL   RBC 5.06 4.40 - 5.90 MIL/uL   Hemoglobin 16.7 13.0 - 18.0 g/dL   HCT 47.7 40.0 - 52.0 %   MCV 94.4 80.0 - 100.0 fL   MCH 33.0 26.0 - 34.0 pg   MCHC 34.9 32.0 - 36.0 g/dL   RDW 13.8 11.5 - 14.5 %   Platelets 223 150 - 440 K/uL    Comment: Performed at [facility], [address], [place], [place] [address]  Urine Drug Screen, Qualitative     Status: None   Collection Time: 12/09/17 11:58 AM  Result Value Ref Range   Tricyclic, Ur Screen NONE DETECTED NONE DETECTED   Amphetamines, Ur Screen NONE DETECTED NONE DETECTED   MDMA (Ecstasy)Ur Screen NONE  DETECTED NONE DETECTED   Cocaine Metabolite,Ur [place] NONE DETECTED NONE DETECTED   Opiate, Ur Screen NONE DETECTED NONE DETECTED   Phencyclidine (PCP) Ur S NONE DETECTED NONE DETECTED   Cannabinoid 50 Ng, Ur [place] NONE DETECTED NONE DETECTED   Barbiturates, Ur Screen NONE DETECTED NONE DETECTED   Benzodiazepine, Ur Scrn NONE DETECTED NONE DETECTED   Methadone Scn, Ur NONE DETECTED NONE DETECTED    Comment: (NOTE) Tricyclics + metabolites, urine    Cutoff 1000 ng/mL Amphetamines + metabolites, urine  Cutoff 1000 ng/mL MDMA (Ecstasy), urine              Cutoff 500 ng/mL Cocaine Metabolite, urine          Cutoff 300 ng/mL Opiate + metabolites, urine        Cutoff 300 ng/mL Phencyclidine (PCP), urine         Cutoff 25 ng/mL Cannabinoid, urine                 Cutoff 50 ng/mL Barbiturates + metabolites, urine  Cutoff 200 ng/mL Benzodiazepine, urine              Cutoff 200 ng/mL Methadone, urine                   Cutoff 300 ng/mL The urine drug screen provides only a preliminary, unconfirmed analytical test result and should not be used for non-medical purposes. Clinical consideration and professional judgment should be applied to any positive drug screen result due to possible interfering substances. A more specific alternate chemical method must be used in order to obtain a confirmed analytical result. Gas chromatography / mass spectrometry (GC/MS) is the preferred confirmat ory method. Performed at [facility], [address] [place], [place] [address]   Urinalysis, Complete w Microscopic     Status: Abnormal   Collection Time: 12/09/17 11:58 AM  Result Value Ref Range   Color, Urine STRAW (A) YELLOW   APPearance CLEAR (A)  CLEAR   Specific Gravity, Urine 1.003 (L) 1.005 - 1.030   pH 6.0 5.0 - 8.0   Glucose, UA NEGATIVE NEGATIVE mg/dL   Hgb urine dipstick NEGATIVE NEGATIVE   Bilirubin Urine NEGATIVE NEGATIVE   Ketones, ur NEGATIVE NEGATIVE mg/dL   Protein, ur NEGATIVE  NEGATIVE mg/dL   Nitrite NEGATIVE NEGATIVE   Leukocytes, UA NEGATIVE NEGATIVE   WBC, UA 0-5 0 - 5 WBC/hpf   Bacteria, UA NONE SEEN NONE SEEN   Squamous Epithelial / LPF NONE SEEN 0 - 5    Comment: Performed at [facility], [address] [place], [place] [address]    Current Facility-Administered Medications  Medication Dose Route Frequency Provider Last Rate Last Dose  . divalproex (DEPAKOTE) DR tablet 500 mg  500 mg Oral Q12H Earleen Newport, MD   500 mg at 12/10/17 1035  . haloperidol (HALDOL) tablet 5 mg  5 mg Oral BID Earleen Newport, MD   5 mg at 12/10/17 1035  . haloperidol decanoate (HALDOL DECANOATE) 100 MG/ML injection 100 mg  100 mg Intramuscular Once Zoey Bidwell T, MD      . traZODone (DESYREL) tablet 150 mg  150 mg Oral QHS PRN Earleen Newport, MD   150 mg at 12/09/17 2154   Current Outpatient Medications  Medication Sig Dispense Refill  . benztropine (COGENTIN) 1 MG tablet Take 1 tablet (1 mg total) by mouth 2 (two) times daily. (Patient not taking: Reported on 02/27/2016) 60 tablet 0  . divalproex (DEPAKOTE ER) 500 MG 24 hr tablet Take 1 tablet (500 mg total) by mouth 2 (two) times daily. (Patient not taking: Reported on 02/27/2016) 60 tablet 0  . haloperidol (HALDOL) 5 MG tablet Take 1 tablet (5 mg total) by mouth 2 (two) times daily. (Patient not taking: Reported on 02/27/2016) 60 tablet 0  . traZODone (DESYREL) 150 MG tablet Take 1 tablet (150 mg total) by mouth at bedtime. (Patient not taking: Reported on 02/27/2016) 30 tablet 0    Musculoskeletal: Strength & Muscle Tone: within normal limits Gait & Station: normal Patient leans: N/A  Psychiatric Specialty Exam: Physical Exam  Nursing note and vitals reviewed. Constitutional: He appears well-developed and well-nourished.  HENT:  Head: Normocephalic and atraumatic.  Eyes: Pupils are equal, round, and reactive to light. Conjunctivae are normal.  Neck: Normal range of motion.   Cardiovascular: Regular rhythm and normal heart sounds.  Respiratory: Effort normal. No respiratory distress.  GI: Soft.  Musculoskeletal: Normal range of motion.  Neurological: He is alert.  Skin: Skin is warm and dry.  Psychiatric: His affect is blunt. His speech is delayed. He is slowed. Thought content is not paranoid. Cognition and memory are impaired. He expresses impulsivity. He expresses no homicidal and no suicidal ideation.    Review of Systems  Constitutional: Negative.   HENT: Negative.   Eyes: Negative.   Respiratory: Negative.   Cardiovascular: Negative.   Gastrointestinal: Negative.   Musculoskeletal: Negative.   Skin: Negative.   Neurological: Negative.   Psychiatric/Behavioral: Positive for substance abuse. Negative for depression, hallucinations, memory loss and suicidal ideas. The patient has insomnia. The patient is not nervous/anxious.     Blood pressure 137/84, pulse 75, temperature 98.1 F (36.7 C), temperature source Oral, resp. rate 20, height '5\' 9"'  (1.753 m), weight 104.3 kg, SpO2 100 %.Body mass index is 33.97 kg/m.  General Appearance: Disheveled  Eye Contact:  Fair  Speech:  Normal Rate  Volume:  Decreased  Mood:  Euthymic  Affect:  Constricted  Thought Process:  Coherent  Orientation:  Full (Time, Place, and Person)  Thought Content:  Logical  Suicidal Thoughts:  No  Homicidal Thoughts:  No  Memory:  Immediate;   Fair Recent;   Fair Remote;   Fair  Judgement:  Fair  Insight:  Fair  Psychomotor Activity:  Decreased  Concentration:  Concentration: Fair  Recall:  [employer] of Knowledge:  Fair  Language:  Fair  Akathisia:  No  Handed:  Right  AIMS (if indicated):     Assets:  Desire for Improvement Housing Physical Health  ADL's:  Intact  Cognition:  Impaired,  Mild  Sleep:        Treatment Plan Summary: Daily contact with patient to assess and evaluate symptoms and progress in treatment, Medication management and Plan Patient is  calm and appropriate here in the emergency room.  Denies suicidal or homicidal ideation.  Admits that he knows he ought to be taking his medicine.  Patient agrees to my suggestion that we get him his Haldol Decanoate shot today.  Orders completed for 100 mg Haldol Decanoate.  He needs to be following up at [facility] and agrees to do so.  Currently does not meet commitment criteria.  Discontinue IVC he can be followed up in the community case reviewed with emergency room doctor and TTS.  Disposition: No evidence of imminent risk to self or others at present.   Patient does not meet criteria for psychiatric inpatient admission. Supportive therapy provided about ongoing stressors. Discussed crisis plan, support from social network, calling 911, coming to the Emergency Department, and calling Suicide Hotline.  Alethia Berthold, MD 12/10/2017 1:19 PM

## 2017-12-10 NOTE — ED Provider Notes (Signed)
-----------------------------------------   1:43 PM on 12/10/2017 -----------------------------------------  The patient has been evaluated by Dr. Toni Amend from psychiatry.  He rescinded the IVC.  He has cleared the patient for discharge.  The patient will follow-up at [facility] as an outpatient.  Return precautions provided.  The patient is stable for discharge home at this time.   Dionne Bucy, MD 12/10/17 1343

## 2017-12-10 NOTE — ED Notes (Signed)
IVC rescinded/ SOC completed

## 2017-12-10 NOTE — ED Notes (Signed)
Pt given breakfast tray and is eating at this time.

## 2017-12-10 NOTE — ED Provider Notes (Signed)
-----------------------------------------   8:03 AM on 12/10/2017 -----------------------------------------   Blood pressure 137/85, pulse 74, temperature 98.2 F (36.8 C), temperature source Oral, resp. rate 20, height 5\' 9"  (1.753 m), weight 104.3 kg, SpO2 97 %.  The patient had no acute events since last update.  Calm and cooperative at this time.  Disposition is pending Psychiatry/Behavioral Medicine team recommendations.    Dionne Bucy, MD 12/10/17 [phone]

## 2017-12-10 NOTE — ED Notes (Signed)
Pt took shower and returned all items to RN station

## 2017-12-10 NOTE — Discharge Instructions (Addendum)
Follow-up with your regular doctors at [facility].  Return to ER for any new or worsening symptoms that concern you.

## 2017-12-10 NOTE — ED Notes (Signed)
Patient alert and oriented. Patient denies SI/HI and A/V hallucinations. Patient is pleasant and cooperative. Patient is taking medications as prescribed. Patient has been in the milieu interacting with staff and peers appropriately. Q 15 minute checks in progress and patient remains safe on unit. Patient Plan of Care discussed with him and he has no questions at this time. Monitoring continues.

## 2017-12-10 NOTE — ED Notes (Signed)
Pt given ginger ale.

## 2017-12-10 NOTE — ED Notes (Signed)
Patient is speaking with S.O.C. MD Dr. Johnathan Hausen.

## 2017-12-10 NOTE — BH Assessment (Addendum)
Assessment Note  Brenan L Shidler is an 42 y.o. male to ED via [employer] after mom called 911 due to concern for pt's wellbeing. Police stated that mom reports pt had been "acting up for the past 3 days and not taking his medication." Pt ETOH >200 and agitated with officers upon arrival. At time of assessment, pt calmer and more coherent. Pt reports, "Someone called the police on me because they said I was zig-zagging in the road. How do you zig-zag in the road when walking? I think my ex did it because she was mad I wouldn't spend time with her yesterday." Pt unconcerned with alcohol use and states he drinks at least two beers per day. Pt currently unemployed. States he currently has no legal charges however, Clinical research associate located records indicating pt has a court date on 01/15/18 for 3 misdemeanors (assault on government official, Pension scheme manager, and communicating threats). Pt most recent hospitalizations were in 2017 due to alcohol use, not taking meds, being aggressive, and paranoia/ agitation.Pt denies SI, HI, AH, VH at time of assessment.   Diagnosis: Schizophrenia  Past Medical History:  Past Medical History:  Diagnosis Date  . Schizo affective schizophrenia [facility])     Past Surgical History:  Procedure Laterality Date  . ORIF ANKLE FRACTURE Left 02/02/2015   Procedure: OPEN REDUCTION INTERNAL FIXATION (ORIF) ANKLE FRACTURE;  Surgeon: Christena Flake, MD;  Location: [facility] ORS;  Service: Orthopedics;  Laterality: Left;  . SMALL INTESTINE SURGERY      Family History: No family history on file.  Social History:  reports that he has been smoking. He has been smoking about 1.00 pack per day. He has never used smokeless tobacco. He reports that he drinks alcohol. He reports that he does not use drugs.  Additional Social History:  Alcohol / Drug Use Pain Medications: see PTA Prescriptions: see PTA Over the Counter: see PTA History of alcohol / drug use?: Yes Longest period of sobriety (when/how  long): unable to quanitfy Negative Consequences of Use: Personal relationships, Financial Substance #1 Name of Substance 1: alcohol 1 - Age of First Use: 22 1 - Amount (size/oz): 2 beers 1 - Frequency: daily 1 - Duration: varies 1 - Last Use / Amount: yesterday Substance #2 Name of Substance 2: cigarettes 2 - Age of First Use: 22 2 - Amount (size/oz): 2 packs  2 - Frequency: daily 2 - Duration: varies 2 - Last Use / Amount: yesterday  CIWA: CIWA-Ar BP: 137/85 Pulse Rate: 74 COWS:    Allergies: No Known Allergies  Home Medications:  (Not in a hospital admission)  OB/GYN Status:  No LMP for male patient.  General Assessment Data Location of Assessment: [facility] ED TTS Assessment: In system Is this a Tele or Face-to-Face Assessment?: Face-to-Face Is this an Initial Assessment or a Re-assessment for this encounter?: Initial Assessment Patient Accompanied by:: N/A Language Other than English: No Living Arrangements: Other (Comment) What gender do you identify as?: Male Marital status: Single Pregnancy Status: No Living Arrangements: Spouse/significant other Can pt return to current living arrangement?: Yes Admission Status: Involuntary Petitioner: Family member Is patient capable of signing voluntary admission?: No Referral Source: Self/Family/Friend Insurance type: None  Medical Screening Exam [facility] Walk-in ONLY) Medical Exam completed: Yes  Crisis Care Plan Living Arrangements: Spouse/significant other Legal Guardian: Other:(self) Name of Psychiatrist: None currently Name of Therapist: None currently  Education Status Is patient currently in school?: No Is the patient employed, unemployed or receiving disability?: Receiving disability income  Risk to self with the past 6 months Suicidal Ideation: No Has patient been a risk to self within the past 6 months prior to admission? : No Suicidal Intent: No Has patient had any suicidal intent within the past 6 months  prior to admission? : No Is patient at risk for suicide?: No, but patient needs Medical Clearance Suicidal Plan?: No Has patient had any suicidal plan within the past 6 months prior to admission? : No Access to Means: No What has been your use of drugs/alcohol within the last 12 months?: pt reports to pt drinking alcohol daily  Previous Attempts/Gestures: No How many times?: 0 Other Self Harm Risks: substance abuse Triggers for Past Attempts: Spouse contact Intentional Self Injurious Behavior: None Family Suicide History: No Recent stressful life event(s): Conflict (Comment) Persecutory voices/beliefs?: No Depression: No Substance abuse history and/or treatment for substance abuse?: Yes Suicide prevention information given to non-admitted patients: Not applicable  Risk to Others within the past 6 months Homicidal Ideation: No Does patient have any lifetime risk of violence toward others beyond the six months prior to admission? : No(Pt denies) Thoughts of Harm to Others: No Current Homicidal Intent: No Current Homicidal Plan: No Access to Homicidal Means: No Identified Victim: None identified History of harm to others?: No Assessment of Violence: None Noted Violent Behavior Description: None noted Does patient have access to weapons?: No Criminal Charges Pending?: No  Psychosis Hallucinations: None noted Delusions: None noted  Mental Status Report Appearance/Hygiene: Disheveled Eye Contact: Good Motor Activity: Freedom of movement Speech: Logical/coherent Level of Consciousness: Alert, Irritable Mood: Irritable, Pleasant Affect: Irritable Anxiety Level: None Thought Processes: Coherent, Relevant Judgement: Partial Orientation: Appropriate for developmental age, Not oriented Obsessive Compulsive Thoughts/Behaviors: None  Cognitive Functioning Concentration: Good Memory: Recent Impaired, Remote Impaired Is patient IDD: No Insight: Fair Impulse Control:  Fair Appetite: Good Have you had any weight changes? : Loss Amount of the weight change? (lbs): 0 lbs Sleep: No Change Total Hours of Sleep: 8 Vegetative Symptoms: None  ADLScreening [facility] Assessment Services) Patient's cognitive ability adequate to safely complete daily activities?: Yes Patient able to express need for assistance with ADLs?: Yes Independently performs ADLs?: Yes (appropriate for developmental age)  Prior Inpatient Therapy Prior Inpatient Therapy: Yes Prior Therapy Dates: 2017 Prior Therapy Facilty/Provider(s): [facility] Reason for Treatment: schizophrenia, SI  Prior Outpatient Therapy Prior Outpatient Therapy: No Does patient have an ACCT team?: No Does patient have Intensive In-House Services?  : No Does patient have [facility] services? : No Does patient have P4CC services?: No  ADL Screening (condition at time of admission) Patient's cognitive ability adequate to safely complete daily activities?: Yes Is the patient deaf or have difficulty hearing?: No Does the patient have difficulty seeing, even when wearing glasses/contacts?: No Does the patient have difficulty concentrating, remembering, or making decisions?: No Patient able to express need for assistance with ADLs?: Yes Does the patient have difficulty dressing or bathing?: No Independently performs ADLs?: Yes (appropriate for developmental age) Does the patient have difficulty walking or climbing stairs?: No Weakness of Legs: None Weakness of Arms/Hands: None  Home Assistive Devices/Equipment Home Assistive Devices/Equipment: None  Therapy Consults (therapy consults require a physician order) PT Evaluation Needed: No OT Evalulation Needed: No SLP Evaluation Needed: No Abuse/Neglect Assessment (Assessment to be complete while patient is alone) Abuse/Neglect Assessment Can Be Completed: Yes Physical Abuse: Denies Verbal Abuse: Denies Sexual Abuse: Denies Exploitation of patient/patient's resources:  Denies Self-Neglect: Denies Values / Beliefs Cultural Requests During Hospitalization: None  Spiritual Requests During Hospitalization: None Consults Spiritual Care Consult Needed: No Social Work Consult Needed: No Merchant navy officer (For Healthcare) Does Patient Have a Medical Advance Directive?: No Would patient like information on creating a medical advance directive?: No - Patient declined          Disposition:  Disposition Initial Assessment Completed for this Encounter: Yes Disposition of Patient: (pending disposition) Patient refused recommended treatment: No Mode of transportation if patient is discharged?: Other(will need transport) Patient referred to: (pending disposition)  On Site Evaluation by:   Reviewed with Physician:    Lattie Haw  Mattheo Swindle 12/10/2017 1:54 AM

## 2017-12-10 NOTE — ED Notes (Signed)
Patient informed of discharge and denies SI/HI and A/V hallucinations at this time.

## 2017-12-10 NOTE — ED Notes (Signed)
Pt Re- IVC'd by Dr. Manson Passey

## 2017-12-10 NOTE — ED Notes (Signed)
Patient discharge home. Patient alert, oriented and ambulatory. Patient AVS/Follow-Up/Medications reviewed with him and he verbalized understanding and written copy given to patient. Patient vitals 98.8-135/76-69-18-100% at discharge. Patient given Suicide number to call if needed. Patient escorted to lobby by staff and left with girlfriend.

## 2019-03-20 ENCOUNTER — Other Ambulatory Visit: Payer: Self-pay

## 2019-03-20 ENCOUNTER — Encounter (HOSPITAL_COMMUNITY): Payer: Self-pay | Admitting: Psychiatry

## 2019-03-20 ENCOUNTER — Encounter (HOSPITAL_COMMUNITY): Payer: Self-pay

## 2019-03-20 ENCOUNTER — Inpatient Hospital Stay (HOSPITAL_COMMUNITY)
Admission: AD | Admit: 2019-03-20 | Discharge: 2019-03-22 | DRG: 885 | Disposition: A | Discharge status: Home / Self Care | Payer: No Typology Code available for payment source | Source: Intra-hospital | Attending: Psychiatry | Admitting: Psychiatry

## 2019-03-20 ENCOUNTER — Emergency Department (HOSPITAL_COMMUNITY)
Admission: EM | Admit: 2019-03-20 | Discharge: 2019-03-20 | Disposition: A | Discharge status: Other Acute Inpatient Hospital | Payer: Self-pay | Attending: Emergency Medicine | Admitting: Emergency Medicine

## 2019-03-20 DIAGNOSIS — F1210 Cannabis abuse, uncomplicated: Secondary | ICD-10-CM | POA: Diagnosis present

## 2019-03-20 DIAGNOSIS — Z20828 Contact with and (suspected) exposure to other viral communicable diseases: Secondary | ICD-10-CM | POA: Diagnosis present

## 2019-03-20 DIAGNOSIS — F209 Schizophrenia, unspecified: Secondary | ICD-10-CM | POA: Insufficient documentation

## 2019-03-20 DIAGNOSIS — F17210 Nicotine dependence, cigarettes, uncomplicated: Secondary | ICD-10-CM | POA: Diagnosis present

## 2019-03-20 DIAGNOSIS — Z046 Encounter for general psychiatric examination, requested by authority: Secondary | ICD-10-CM | POA: Insufficient documentation

## 2019-03-20 DIAGNOSIS — R45850 Homicidal ideations: Secondary | ICD-10-CM | POA: Insufficient documentation

## 2019-03-20 DIAGNOSIS — F250 Schizoaffective disorder, bipolar type: Secondary | ICD-10-CM | POA: Diagnosis not present

## 2019-03-20 DIAGNOSIS — Z9114 Patient's other noncompliance with medication regimen: Secondary | ICD-10-CM | POA: Diagnosis not present

## 2019-03-20 DIAGNOSIS — F259 Schizoaffective disorder, unspecified: Secondary | ICD-10-CM | POA: Diagnosis present

## 2019-03-20 DIAGNOSIS — F10129 Alcohol abuse with intoxication, unspecified: Secondary | ICD-10-CM | POA: Diagnosis present

## 2019-03-20 DIAGNOSIS — F10920 Alcohol use, unspecified with intoxication, uncomplicated: Secondary | ICD-10-CM | POA: Insufficient documentation

## 2019-03-20 LAB — COMPREHENSIVE METABOLIC PANEL
ALT: 47 U/L — ABNORMAL HIGH (ref 0–44)
AST: 63 U/L — ABNORMAL HIGH (ref 15–41)
Albumin: 4.5 g/dL (ref 3.5–5.0)
Alkaline Phosphatase: 94 U/L (ref 38–126)
Anion gap: 15 (ref 5–15)
BUN: 11 mg/dL (ref 6–20)
CO2: 21 mmol/L — ABNORMAL LOW (ref 22–32)
Calcium: 8.7 mg/dL — ABNORMAL LOW (ref 8.9–10.3)
Chloride: 103 mmol/L (ref 98–111)
Creatinine, Ser: 1.11 mg/dL (ref 0.61–1.24)
GFR calc Af Amer: >60 mL/min (ref >60)
GFR calc non Af Amer: >60 mL/min (ref >60)
Glucose, Bld: 130 mg/dL — ABNORMAL HIGH (ref 70–99)
Potassium: 3.2 mmol/L — ABNORMAL LOW (ref 3.5–5.1)
Sodium: 139 mmol/L (ref 135–145)
Total Bilirubin: 1 mg/dL (ref 0.3–1.2)
Total Protein: 8 g/dL (ref 6.5–8.1)

## 2019-03-20 LAB — SALICYLATE LEVEL: Salicylate Lvl: <7 mg/dL (ref 2.8–30.0)

## 2019-03-20 LAB — CBC
HCT: 50 % (ref 39.0–52.0)
Hemoglobin: 17.2 g/dL — ABNORMAL HIGH (ref 13.0–17.0)
MCH: 32.6 pg (ref 26.0–34.0)
MCHC: 34.4 g/dL (ref 30.0–36.0)
MCV: 94.7 fL (ref 80.0–100.0)
Platelets: 253 10*3/uL (ref 150–400)
RBC: 5.28 MIL/uL (ref 4.22–5.81)
RDW: 13.6 % (ref 11.5–15.5)
WBC: 8.4 10*3/uL (ref 4.0–10.5)
nRBC: 0 % (ref 0.0–0.2)

## 2019-03-20 LAB — RESPIRATORY PANEL BY RT PCR (FLU A&B, COVID)
Influenza A by PCR: NEGATIVE
Influenza B by PCR: NEGATIVE
SARS Coronavirus 2 by RT PCR: NEGATIVE

## 2019-03-20 LAB — ACETAMINOPHEN LEVEL: Acetaminophen (Tylenol), Serum: <10 ug/mL — ABNORMAL LOW (ref 10–30)

## 2019-03-20 LAB — RAPID URINE DRUG SCREEN, HOSP PERFORMED
Amphetamines: NOT DETECTED
Barbiturates: NOT DETECTED
Benzodiazepines: NOT DETECTED
Cocaine: NOT DETECTED
Opiates: NOT DETECTED
Tetrahydrocannabinol: POSITIVE — AB

## 2019-03-20 LAB — ETHANOL: Alcohol, Ethyl (B): 156 mg/dL — ABNORMAL HIGH (ref <10)

## 2019-03-20 MED ORDER — HYDROXYZINE HCL 25 MG PO TABS
25.0000 mg | ORAL_TABLET | Freq: Three times a day (TID) | ORAL | Status: DC | PRN
  Administered 2019-03-21: 25 mg via ORAL
  Filled 2019-03-20: qty 1

## 2019-03-20 MED ORDER — BENZTROPINE MESYLATE 1 MG PO TABS
1.0000 mg | ORAL_TABLET | Freq: Two times a day (BID) | ORAL | Status: DC
  Administered 2019-03-20 – 2019-03-22 (×3): 1 mg via ORAL
  Filled 2019-03-20: qty 14
  Filled 2019-03-20 (×2): qty 1
  Filled 2019-03-20: qty 14
  Filled 2019-03-20: qty 1
  Filled 2019-03-20: qty 14
  Filled 2019-03-20 (×3): qty 1
  Filled 2019-03-20: qty 14
  Filled 2019-03-20: qty 1

## 2019-03-20 MED ORDER — ALUM & MAG HYDROXIDE-SIMETH 200-200-20 MG/5ML PO SUSP: 30.0000 mL | ORAL | Status: DC | PRN

## 2019-03-20 MED ORDER — POTASSIUM CHLORIDE CRYS ER 20 MEQ PO TBCR
40.0000 meq | EXTENDED_RELEASE_TABLET | Freq: Once | ORAL | Status: AC
  Administered 2019-03-20: 40 meq via ORAL
  Filled 2019-03-20: qty 2

## 2019-03-20 MED ORDER — MAGNESIUM HYDROXIDE 400 MG/5ML PO SUSP: 30.0000 mL | Freq: Every day | ORAL | Status: DC | PRN

## 2019-03-20 MED ORDER — HALOPERIDOL 5 MG PO TABS
10.0000 mg | ORAL_TABLET | Freq: Two times a day (BID) | ORAL | Status: DC
  Administered 2019-03-20 – 2019-03-22 (×3): 10 mg via ORAL
  Filled 2019-03-20 (×9): qty 2

## 2019-03-20 MED ORDER — ACETAMINOPHEN 325 MG PO TABS: 650.0000 mg | ORAL_TABLET | Freq: Four times a day (QID) | ORAL | Status: DC | PRN

## 2019-03-20 MED ORDER — TRAZODONE HCL 50 MG PO TABS
50.0000 mg | ORAL_TABLET | Freq: Every evening | ORAL | Status: DC | PRN
  Administered 2019-03-21: 08:00:00 50 mg via ORAL
  Filled 2019-03-20: qty 1

## 2019-03-20 NOTE — Progress Notes (Signed)
Recreation Therapy Notes  INPATIENT RECREATION THERAPY ASSESSMENT  Patient Details Name: Antonio Mccoy MRN: [id number] DOB: Oct 05, 1975 Today's Date: 03/20/2019       Information Obtained From: Patient  Able to Participate in Assessment/Interview: Yes  Patient Presentation: Alert  Reason for Admission (Per Patient): Other (Comments)(Pt stated confusion on someone else's part.)  Patient Stressors: (None identified)  Coping Skills:   TV, Sports, Music, Exercise, Substance Abuse, Talk, Art, Prayer, Avoidance, Read, Hot Bath/Shower  Leisure Interests (2+):  Exercise - Lifting Weights, Petra Kuba - Other (Comment)(Ride scooter)  Frequency of Recreation/Participation: Weekly  Awareness of Community Resources:  Yes  Community Resources:  Other (Comment)(Baseball field, Playground)  Current Use: No  If no, Barriers?: Other (Comment)(For kids)  Expressed Interest in [place]: No  [place] of Residence:  [place]  Patient Main Form of Transportation: Other (Comment)(Scooter)  Patient Strengths:  Conservation officer, nature; Reading  Patient Identified Areas of Improvement:  Drinking; Smoking  Patient Goal for Hospitalization:  "take meds, get well and get out of here"  Current SI (including self-harm):  No  Current HI:  No  Current AVH: No  Staff Intervention Plan: Group Attendance, Collaborate with Interdisciplinary Treatment Team  Consent to Intern Participation: N/A    Victorino Sparrow, LRT/CTRS  Victorino Sparrow A 03/20/2019, 12:02 PM

## 2019-03-20 NOTE — ED Notes (Signed)
Pt states he is unable to void fluids encouraged.

## 2019-03-20 NOTE — ED Provider Notes (Signed)
[place] DEPT Provider Note   CSN: [id number] Arrival date & time: 03/20/19  0033     History Chief Complaint  Patient presents with  . Medical Clearance    Antonio Mccoy is a 43 y.o. male.   43 year old male with a history of schizoaffective schizophrenia as well as alcohol use disorder presents to the emergency department under IVC taken out by his fiance.  IVC papers state that patient has been acting erratically and was threatening her with a hatchet tonight.  He confirms that he ran out of his psychiatric medications approximately 2 months ago.  Denies any auditory or visual hallucinations as well as SI or HI.  Does endorse smoking weed recently.  No additional complaints for this visit.  The history is provided by the patient. No language interpreter was used.       Past Medical History:  Diagnosis Date  . Schizo affective schizophrenia [facility])     Patient Active Problem List   Diagnosis Date Noted  . Alcohol use disorder, severe, dependence ([place]) 04/19/2015  . Alcohol withdrawal ([place]) 04/19/2015  . Tobacco use disorder 04/19/2015  . Undifferentiated schizophrenia ([place])   . Ankle fracture 02/02/2015    Past Surgical History:  Procedure Laterality Date  . ORIF ANKLE FRACTURE Left 02/02/2015   Procedure: OPEN REDUCTION INTERNAL FIXATION (ORIF) ANKLE FRACTURE;  Surgeon: Corky Mull, MD;  Location: [facility] ORS;  Service: Orthopedics;  Laterality: Left;  . SMALL INTESTINE SURGERY         History reviewed. No pertinent family history.  Social History   Tobacco Use  . Smoking status: Current Every Day Smoker    Packs/day: 1.00  . Smokeless tobacco: Never Used  Substance Use Topics  . Alcohol use: Yes  . Drug use: No    Home Medications Prior to Admission medications   Medication Sig Start Date End Date Taking? Authorizing Provider  benztropine (COGENTIN) 1 MG tablet Take 1 tablet (1 mg total) by mouth 2 (two) times  daily. Patient not taking: Reported on 02/27/2016 04/20/15   Pucilowska, Herma Ard B, MD  divalproex (DEPAKOTE ER) 500 MG 24 hr tablet Take 1 tablet (500 mg total) by mouth 2 (two) times daily. Patient not taking: Reported on 02/27/2016 04/21/15   Pucilowska, Herma Ard B, MD  haloperidol (HALDOL) 5 MG tablet Take 1 tablet (5 mg total) by mouth 2 (two) times daily. Patient not taking: Reported on 02/27/2016 04/20/15   Pucilowska, Wardell Honour, MD  traZODone (DESYREL) 150 MG tablet Take 1 tablet (150 mg total) by mouth at bedtime. Patient not taking: Reported on 02/27/2016 04/21/15   Clovis Fredrickson, MD    Allergies    Patient has no known allergies.  Review of Systems   Review of Systems  Ten systems reviewed and are negative for acute change, except as noted in the HPI.    Physical Exam Updated Vital Signs BP 132/81 (BP Location: Left Arm)   Pulse (!) 104   Temp 98.5 F (36.9 C) (Oral)   Resp 18   Ht 5\' 10"  (1.778 m)   Wt 99.8 kg   SpO2 96%   BMI 31.57 kg/m   Physical Exam Vitals and nursing note reviewed.  Constitutional:      General: He is not in acute distress.    Appearance: He is well-developed. He is not diaphoretic.  HENT:     Head: Normocephalic and atraumatic.  Eyes:     General: No scleral icterus.  Conjunctiva/sclera: Conjunctivae normal.  Pulmonary:     Effort: Pulmonary effort is normal. No respiratory distress.  Musculoskeletal:        General: Normal range of motion.     Cervical back: Normal range of motion.  Skin:    General: Skin is warm and dry.     Coloration: Skin is not pale.     Findings: No erythema or rash.  Neurological:     Mental Status: He is alert and oriented to person, place, and time.  Psychiatric:        Attention and Perception: He does not perceive auditory or visual hallucinations.        Behavior: Behavior is cooperative.        Thought Content: Thought content does not include homicidal or suicidal ideation.     ED  Results / Procedures / Treatments   Labs (all labs ordered are listed, but only abnormal results are displayed) Labs Reviewed  COMPREHENSIVE METABOLIC PANEL - Abnormal; Notable for the following components:      Result Value   Potassium 3.2 (*)    CO2 21 (*)    Glucose, Bld 130 (*)    Calcium 8.7 (*)    AST 63 (*)    ALT 47 (*)    All other components within normal limits  ETHANOL - Abnormal; Notable for the following components:   Alcohol, Ethyl (B) 156 (*)    All other components within normal limits  ACETAMINOPHEN LEVEL - Abnormal; Notable for the following components:   Acetaminophen (Tylenol), Serum <10 (*)    All other components within normal limits  CBC - Abnormal; Notable for the following components:   Hemoglobin 17.2 (*)    All other components within normal limits  RESPIRATORY PANEL BY RT PCR (FLU A&B, COVID)  SALICYLATE LEVEL  RAPID URINE DRUG SCREEN, HOSP PERFORMED    EKG None  Radiology No results found.  Procedures Procedures (including critical care time)  Medications Ordered in ED Medications  potassium chloride SA (KLOR-CON) CR tablet 40 mEq (has no administration in time range)    ED Course  I have reviewed the triage vital signs and the nursing notes.  Pertinent labs & imaging results that were available during my care of the patient were reviewed by me and considered in my medical decision making (see chart for details).    MDM Rules/Calculators/A&P                      43 year old male with a history of schizophrenia presents for psychiatric evaluation.  IVC taken out by fianc after he threatened her with a hatchet.  He has been medically cleared and evaluated by TTS who recommend inpatient treatment.  Pending placement.  Disposition to be determined by oncoming ED provider.   Final Clinical Impression(s) / ED Diagnoses Final diagnoses:  Schizophrenia, unspecified type (HCC)  Alcoholic intoxication without complication [facility])    Rx /  DC Orders ED Discharge Orders    None       Antonio Madura, PA-C 03/20/19 [id number]    Shon Baton, MD 03/20/19 [phone]

## 2019-03-20 NOTE — ED Notes (Signed)
Attempted to  Call report left on extended hold will call again in 30 minutes.

## 2019-03-20 NOTE — Tx Team (Signed)
Initial Treatment Plan 03/20/2019 6:40 AM [name] MVE:[id number]    PATIENT STRESSORS: Marital or family conflict Medication change or noncompliance   PATIENT STRENGTHS: General fund of knowledge Motivation for treatment/growth   PATIENT IDENTIFIED PROBLEMS: Medication non-compliance  ETOH  "nothing , I shouldn't be here"                 DISCHARGE CRITERIA:  Improved stabilization in mood, thinking, and/or behavior Verbal commitment to aftercare and medication compliance  PRELIMINARY DISCHARGE PLAN: Attend PHP/IOP Outpatient therapy  PATIENT/FAMILY INVOLVEMENT: This treatment plan has been presented to and reviewed with the patient, Antonio Mccoy.  The patient and family have been given the opportunity to ask questions and make suggestions.  Providence Crosby, RN 03/20/2019, 6:40 AM

## 2019-03-20 NOTE — [facility] Suicide Risk Assessment (Signed)
[facility] Admission Suicide Risk Assessment   Nursing information obtained from:  Patient Demographic factors:  Male, Unemployed Current Mental Status:  NA Loss Factors:  Decline in physical health Historical Factors:  NA Risk Reduction Factors:  Living with another person, especially a relative  Total Time spent with patient: 45 minutes Principal Problem: Schizoaffective versus schizophrenia/acute exacerbation Diagnosis:  Active Problems:   Schizoaffective disorder (HCC)  Subjective Data: Latest of multiple admissions for Antonio Mccoy requiring petition for general dangerousness off of his Haldol for his schizophrenic versus schizoaffective type condition, complicated by cannabis and alcohol abuse/intoxication  Continued Clinical Symptoms:  Alcohol Use Disorder Identification Test Final Score (AUDIT): 13 The "Alcohol Use Disorders Identification Test", Guidelines for Use in Primary Care, Second Edition.  World Pharmacologist [facility]). Score between 0-7:  no or low risk or alcohol related problems. Score between 8-15:  moderate risk of alcohol related problems. Score between 16-19:  high risk of alcohol related problems. Score 20 or above:  warrants further diagnostic evaluation for alcohol dependence and treatment.   CLINICAL FACTORS:   Bipolar Disorder:   Mixed State  Musculoskeletal: Strength & Muscle Tone: within normal limits Gait & Station: normal Patient leans: N/A  Psychiatric Specialty Exam: Physical Exam  Nursing note and vitals reviewed. Constitutional: He appears well-developed and well-nourished.  Cardiovascular: Normal rate and regular rhythm.    Review of Systems  Constitutional: Negative.   Respiratory: Negative.   Cardiovascular: Negative.   Gastrointestinal: Negative.   Genitourinary: Negative.   Musculoskeletal: Negative.   Allergic/Immunologic: Negative.   Neurological: Negative.     Blood pressure 137/85, pulse 98, temperature 98.7 F (37.1 C),  temperature source Oral, resp. rate 18, height 5\' 10"  (1.778 m), weight 98 kg.Body mass index is 30.99 kg/m.  General Appearance: Casual  Eye Contact:  Good  Speech:  Clear and Coherent  Volume:  Decreased  Mood:  Dysphoric  Affect:  Constricted  Thought Process:  Goal Directed and Descriptions of Associations: Circumstantial  Orientation:  Full (Time, Place, and Person)  Thought Content:  He denies recent volatility he denies thoughts of harming self or others he denies auditory or visual hallucinations no delusional statements made or discerned in the context of his speech  Suicidal Thoughts:  No  Homicidal Thoughts:  No  Memory:  Immediate;   Fair Recent;   Fair Remote;   Fair  Judgement:  Fair  Insight:  Fair  Psychomotor Activity:  Normal  Concentration:  Concentration: Fair and Attention Span: Fair  Recall:  [employer] of Knowledge:  Fair  Language:  Fair  Akathisia:  NA  Handed:  Right  AIMS (if indicated):     Assets:  Communication Skills Desire for Improvement Housing  ADL's:  Intact  Cognition:  WNL  Sleep:         COGNITIVE FEATURES THAT CONTRIBUTE TO RISK:  Polarized thinking    SUICIDE RISK:   Minimal: No identifiable suicidal ideation.  Patients presenting with no risk factors but with morbid ruminations; may be classified as minimal risk based on the severity of the depressive symptoms  PLAN OF CARE: Admit for reinstitution of meds cognitive and rehab based therapies  I certify that inpatient services furnished can reasonably be expected to improve the patient's condition.   Antonio Hai, MD 03/20/2019, 10:54 AM

## 2019-03-20 NOTE — BH Assessment (Addendum)
Tele Assessment Note   Patient Name: Antonio Mccoy MRN: [id number] Referring Physician: Ross Marcus, MD Location of Patient: [facility] Location of Provider: Behavioral Health TTS Department  Antonio Mccoy is an 43 y.o. male. Per EDP, ", "43 year old male with a history of schizoaffective schizophrenia as well as alcohol use disorder presents to the emergency department under IVC taken out by his fiance.  IVC papers state that patient has been acting erratically and was threatening her with a hatchet tonight.  He confirms that he ran out of his psychiatric medications approximately 2 months ago.  Denies any auditory or visual hallucinations as well as SI or HI.  Does endorse smoking weed recently.  No additional complaints for this visit."   TTS:  Pt presented sleepy and mostly uncooperative during assessment. Pt answered a few questions but was falling asleep multiple times during assessment. Pt was asked what brought him in. Pt states someone called the police on him. Pt states the police was very aggressive towards him. Pt denies SI, HI, AVH . Pt admits to smoking marijuana a few hours ago and says he cant recall how much. Pt was asked about his psych history and he denied having a provider but states he takes medications but cant remember. Pt states he has been getting little to no sleep but does not specify why. TTS continued to ask more questions, pt dozes off during assessment.  Pt was asked to give permission to speak to wife, pt responded as if he didn't know who she was, but said yes.      Per IVC paperwork, : He has been without his medications for 2 months. He has been drinking alcohol and maybe doing illegal drugs, he started behaving in an abnormal way on Friday but tonight his behavior escalateded erratically. He has been running to the yard and back and forth and jumping up and down on the front porch in a bizarre manner. Tonight he threatened to hit me in the head with a  hatchet and yelling and cussing loudly the whole time. He was outside with his shorts on in the middle of the street not moving out of the way of the cars. The officers are at the house and witnessed his behavior".    TTS spoke with Arna Snipe [phone], pts wife. She states that pt has not slept since Friday December 11. She says he threatened  her with hatchet and he tried to walk in traffic, feels he is danger to her and himself. She sates she feels he is using drugs other than marijuana when he gets into these episodes. She states he also stopped taking his medications 2 months ago.  Diagnosis: F20.9 Schizophrenia  Past Medical History:  Past Medical History:  Diagnosis Date  . Schizo affective schizophrenia [facility])     Past Surgical History:  Procedure Laterality Date  . ORIF ANKLE FRACTURE Left 02/02/2015   Procedure: OPEN REDUCTION INTERNAL FIXATION (ORIF) ANKLE FRACTURE;  Surgeon: Christena Flake, MD;  Location: [facility] ORS;  Service: Orthopedics;  Laterality: Left;  . SMALL INTESTINE SURGERY      Family History: History reviewed. No pertinent family history.  Social History:  reports that he has been smoking. He has been smoking about 1.00 pack per day. He has never used smokeless tobacco. He reports current alcohol use. He reports that he does not use drugs.  Additional Social History:     CIWA: CIWA-Ar BP: (!) 171/91 Pulse Rate: (!) 117 COWS:  Allergies: No Known Allergies  Home Medications: (Not in a hospital admission)   OB/GYN Status:  No LMP for male patient.  General Assessment Data Location of Assessment: [facility] TTS Assessment: In system Is this a Tele or Face-to-Face Assessment?: Tele Assessment Is this an Initial Assessment or a Re-assessment for this encounter?: Initial Assessment Patient Accompanied by:: Other Language Other than English: No Living Arrangements: Other (Comment) What gender do you identify as?: Male Marital status: Married Living  Arrangements: Spouse/significant other Can pt return to current living arrangement?: Yes Admission Status: Involuntary Is patient capable of signing voluntary admission?: No Referral Source: Other Insurance type: none     Crisis Care Plan Living Arrangements: Spouse/significant other     Risk to self with the past 6 months Suicidal Ideation: No Has patient been a risk to self within the past 6 months prior to admission? : No Suicidal Intent: No Has patient had any suicidal intent within the past 6 months prior to admission? : (UTA) Is patient at risk for suicide?: (UTA) Suicidal Plan?: (uta) Has patient had any suicidal plan within the past 6 months prior to admission? : (UTA) Access to Means: (UTA) What has been your use of drugs/alcohol within the last 12 months?: weed Previous Attempts/Gestures: (UTA) How many times?: (UTA) Other Self Harm Risks: (UTA) Triggers for Past Attempts: (UTA) Intentional Self Injurious Behavior: (UTA) Family Suicide History: (UTA) Recent stressful life event(s): (UTA) Persecutory voices/beliefs?: (UTA) Depression: (UTA) Depression Symptoms: (UTA) Substance abuse history and/or treatment for substance abuse?: No Suicide prevention information given to non-admitted patients: Not applicable  Risk to Others within the past 6 months Homicidal Ideation: No Does patient have any lifetime risk of violence toward others beyond the six months prior to admission? : Unknown Thoughts of Harm to Others: (UTA) Current Homicidal Intent: No Current Homicidal Plan: (UTA) Access to Homicidal Means: (UTA) Identified Victim: (UTA) History of harm to others?: (UTA) Assessment of Violence: On admission Violent Behavior Description: aggressive towards spouse and police Does patient have access to weapons?: (UTA) Criminal Charges Pending?: No Does patient have a court date: No Is patient on probation?: No  Psychosis Hallucinations: (UTA) Delusions:  (UTA)  Mental Status Report Appearance/Hygiene: In hospital gown Eye Contact: Poor Motor Activity: Freedom of movement Speech: Slow, Incoherent Level of Consciousness: Quiet/awake, Sleeping Mood: Irritable Affect: Appropriate to circumstance Anxiety Level: None Thought Processes: Unable to Assess Judgement: Impaired Orientation: Unable to assess Obsessive Compulsive Thoughts/Behaviors: None  Cognitive Functioning Concentration: Poor Memory: Unable to Assess Is patient IDD: No Insight: Poor Impulse Control: Poor Appetite: (UTA) Have you had any weight changes? : (UTA) Sleep: Decreased Total Hours of Sleep: (little to none) Vegetative Symptoms: Unable to Assess  ADLScreening [facility] Assessment Services) Patient's cognitive ability adequate to safely complete daily activities?: Yes Patient able to express need for assistance with ADLs?: Yes Independently performs ADLs?: Yes (appropriate for developmental age)  Prior Inpatient Therapy Prior Inpatient Therapy: Yes Prior Therapy Dates: 2019 [facility] Reason for Treatment: (schizophrenia)  Prior Outpatient Therapy Prior Outpatient Therapy: (Unknown)  ADL Screening (condition at time of admission) Patient's cognitive ability adequate to safely complete daily activities?: Yes Patient able to express need for assistance with ADLs?: Yes Independently performs ADLs?: Yes (appropriate for developmental age)             Advance Directives (For Healthcare) Does Patient Have a Medical Advance Directive?: No Would patient like information on creating a medical advance directive?: No - Patient declined  Disposition: Per Talbot Grumbling, FNP, recommends pt meets inpatient criteria. Per [facility], pt will be accepted to [facility] pending medical clearance. TTS confirm  Status with attending provider. Disposition Initial Assessment Completed for this Encounter: Yes  This service was provided  via telemedicine using a 2-way, interactive audio and video technology.  Names of all persons participating in this telemedicine service and their role in this encounter. Name: Antonio Mccoy Role: Patient  Name: Antony Contras Role: TTS  Name:  Role:   Name:  Role:     Donato Heinz 03/20/2019 2:33 AM

## 2019-03-20 NOTE — [facility] Suicide Risk Assessment (Signed)
[place] INPATIENT:  Family/Significant Other Suicide Prevention Education  Suicide Prevention Education:  Patient Refusal for Family/Significant Other Suicide Prevention Education: The patient Antonio Mccoy has refused to provide written consent for family/significant other to be provided Family/Significant Other Suicide Prevention Education during admission and/or prior to discharge.  Physician notified.  Chritopher Coster T Aitan Rossbach 03/20/2019, 3:26 PM

## 2019-03-20 NOTE — Progress Notes (Signed)
Recreation Therapy Notes  Date: 12.16.20 Time: 1000 Location: 500 Hall Dayroom    Group Topic: Communication, Team Building, Problem Solving  Goal Area(s) Addresses:  Patient will effectively work with peer towards shared goal.  Patient will identify skills used to make activity successful.  Patient will identify how skills used during activity can be used to reach post d/c goals.   Intervention: STEM Activity  Activity: Straw Bridge. In teams patients were given 15 plastic drinking straws and Mccoy length of masking tape. Using the materials provided patients were asked to build an elevated bridge that can hold Mccoy small puzzle box without collapsing.   Education: Social Skills, Discharge Planning   Education Outcome: Acknowledges education/In group clarification offered/Needs additional education.   Clinical Observations/Feedback: Pt did not attend group.    Antonio Mccoy, LRT/CTRS         Antonio Mccoy 03/20/2019 11:32 AM 
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
## 2019-03-20 NOTE — Progress Notes (Signed)
   03/20/19 2100  Psych Admission Type (Psych Patients Only)  Admission Status Involuntary  Psychosocial Assessment  Patient Complaints Anxiety  Eye Contact Fair  Facial Expression Anxious;Sad  Affect Appropriate to circumstance  Speech Logical/coherent  Interaction Assertive  Motor Activity Slow  Appearance/Hygiene In hospital gown  Behavior Characteristics Cooperative  Mood Anxious  Aggressive Behavior  Effect No apparent injury  Thought Process  Coherency Circumstantial  Content Blaming others  Delusions WDL  Perception WDL  Hallucination None reported or observed  Judgment Poor  Confusion WDL  Danger to Self  Current suicidal ideation? Denies  Danger to Others  Danger to Others None reported or observed   Pt visible on the unit this evening. Pt seen on the phone, pt stated he was doing ok today

## 2019-03-20 NOTE — ED Notes (Signed)
Dispatch paged for transport  To [facility]
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
## 2019-03-21 DIAGNOSIS — F10129 Alcohol abuse with intoxication, unspecified: Secondary | ICD-10-CM

## 2019-03-21 NOTE — Progress Notes (Signed)
Recreation Therapy Notes  Date: 12.17.20 Time: 0950 Location: [address] Dayroom  Group Topic: Anger Management  Goal Area(s) Addresses:  Patient will identify triggers for anger.  Patient will identify Mccoy situation that makes them angry.  Patient will identify what other emotions comes with anger.  Intervention: Worksheets  Activity:  Introduction to Anger Management.  Patients were to identify 3 situations/topics/people that lead to them being angry.  Patients would also identify what they do when angry, problems that have resulted because of anger and identify 3 positive coping skills to use when angry.  Education: Anger Management, Discharge Planning   Education Outcome: Acknowledges education/In group clarification offered/Needs additional education.   Clinical Observations/Feedback:  Pt did not attend group session.     Michela Herst, LRT/CTRS         Antonio Mccoy 03/21/2019 10:28 AM 
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
## 2019-03-21 NOTE — Progress Notes (Signed)
   03/21/19 2100  Psych Admission Type (Psych Patients Only)  Admission Status Involuntary  Psychosocial Assessment  Patient Complaints Anxiety  Eye Contact Fair  Facial Expression Anxious;Sad  Affect Appropriate to circumstance  Speech Logical/coherent  Interaction Assertive  Motor Activity Slow  Appearance/Hygiene In hospital gown  Behavior Characteristics Cooperative  Mood Anxious  Aggressive Behavior  Effect No apparent injury  Thought Process  Coherency Circumstantial  Content Blaming others  Delusions WDL  Perception WDL  Hallucination None reported or observed  Judgment Poor  Confusion WDL  Danger to Self  Current suicidal ideation? Denies  Danger to Others  Danger to Others None reported or observed   Pt stated he was doing fine, pt visible on unit acting appropriately and seen on the phone.

## 2019-03-22 MED ORDER — HALOPERIDOL 5 MG PO TABS: 10.0000 mg | ORAL_TABLET | Freq: Every day | ORAL | Status: DC

## 2019-03-22 MED ORDER — DIVALPROEX SODIUM ER 500 MG PO TB24: 500.0000 mg | ORAL_TABLET | Freq: Two times a day (BID) | ORAL | Status: DC

## 2019-03-22 MED ORDER — HALOPERIDOL 5 MG PO TABS: ORAL_TABLET | ORAL | 1 refills | Status: DC

## 2019-03-22 MED ORDER — TRAZODONE HCL 150 MG PO TABS: 150.0000 mg | ORAL_TABLET | Freq: Every day | ORAL | Status: DC

## 2019-03-22 MED ORDER — HALOPERIDOL 5 MG PO TABS: 5.0000 mg | ORAL_TABLET | Freq: Every day | ORAL | Status: DC

## 2019-03-22 MED ORDER — BENZTROPINE MESYLATE 1 MG PO TABS: 1.0000 mg | ORAL_TABLET | Freq: Two times a day (BID) | ORAL | 2 refills | Status: DC

## 2019-03-22 NOTE — Progress Notes (Signed)
Discharge:  Pt discharge today at 11:30am to private resident. Transportation arranged with Melburn Popper. All inventoried belongings returned to pt and signature obtained.  Pt indicated understanding of arranged appointment with [facility] and prescriptions that need filled.

## 2019-03-22 NOTE — Progress Notes (Signed)
  [facility] Adult Case Management Discharge Plan :  Will you be returning to the same living situation after discharge:  Yes,  home. At discharge, do you have transportation home?: Yes,  girlfriend will pick up. Do you have the ability to pay for your medications: No. Referred to [facility].  Release of information consent forms completed and in the chart;  Letter on chart.  Patient to Follow up at: Follow-up Information    [facility] Follow up on 04/01/2019.   Why: Your hospital discharge appointment will be held by phone on 12/28 at 1:30pm. The provider will call you. Please have access to your hospital discharge paperwork and current medications.  Contact information: [place] [phone]           Next level of care provider has access to [place] and Suicide Prevention discussed: Yes,  with patient. Patient declined consents.  Have you used any form of tobacco in the last 30 days? (Cigarettes, Smokeless Tobacco, Cigars, and/or Pipes): Yes  Has patient been referred to the Quitline?: Patient refused referral  Patient has been referred for addiction treatment: Yes  Joellen Jersey, [place] 03/22/2019, 9:24 AM

## 2019-03-22 NOTE — Progress Notes (Signed)
Recreation Therapy Notes  Date: 12.18.20 Time: 1015 Location: 500 Hall Dayroom  Group Topic: Coping Skills  Goal Area(s) Addresses:  Patient will identify positive coping skills. Patient will identify benefit of using positive coping skills post d/c.  Intervention: Ines Bloomer  Activity: Scientist, clinical (histocompatibility and immunogenetics).  During each turn, patients were give a positive coping skill.  Patients could not repeat coping skills that had already been given.  Once the jenga tower falls over, the game would start over.  Education: Radiographer, therapeutic, Dentist.   Education Outcome: Acknowledges understanding/In group clarification offered/Needs additional education.   Clinical Observations/Feedback: Pt did not attend group session.     Victorino Sparrow, LRT/CTRS         Victorino Sparrow A 03/22/2019 11:28 AM

## 2019-03-22 NOTE — Plan of Care (Signed)
Patient did not attend recreation therapy group sessions.    Antonio Mccoy, LRT/CTRS 

## 2019-03-22 NOTE — Progress Notes (Signed)
[facility] NOVEL CORONAVIRUS (COVID-19) DAILY CHECK-OFF SYMPTOMS - answer yes or no to each - every day NO YES  Have you had a fever in the past 24 hours?  . Fever (Temp > 37.80C / 100F) X   Have you had any of these symptoms in the past 24 hours? . New Cough .  Sore Throat  .  Shortness of Breath .  Difficulty Breathing .  Unexplained Body Aches   X   Have you had any one of these symptoms in the past 24 hours not related to allergies?   . Runny Nose .  Nasal Congestion .  Sneezing   X   If you have had runny nose, nasal congestion, sneezing in the past 24 hours, has it worsened?  X   EXPOSURES - check yes or no X   Have you traveled outside the state in the past 14 days?  X   Have you been in contact with someone with a confirmed diagnosis of COVID-19 or PUI in the past 14 days without wearing appropriate PPE?  X   Have you been living in the same home as a person with confirmed diagnosis of COVID-19 or a PUI (household contact)?    X   Have you been diagnosed with COVID-19?    X              What to do next: Answered NO to all: Answered YES to anything:   Proceed with unit schedule Follow the [facility] Inpatient Flowsheet.   

## 2019-03-22 NOTE — Progress Notes (Signed)
Patient Self Inventory Sheet  reports good appetite, energy level normal, and good sleeping pattern without mediaction. Pt rates his  depression 0 out of 10, hopelessness 0 out of 10, and anxiety at 0 out of 10. Pt denies SI , HI, and  AVH.  Pt reports no physical problems and vitals signs  WNL. Pt states his most important goals are to work on self and understanding self. Medication given as Rx, no adverse reaction observed, Safety maintained with 15 minute checks

## 2019-03-22 NOTE — Progress Notes (Signed)
Recreation Therapy Notes  INPATIENT RECREATION TR PLAN  Patient Details Name: Antonio Mccoy MRN: [id number] DOB: [birth year] Today's Date: 03/22/2019  Rec Therapy Plan Is patient appropriate for Therapeutic Recreation?: Yes Treatment times per week: about 3 days Estimated Length of Stay: 5-7 days TR Treatment/Interventions: Group participation (Comment)  Discharge Criteria Pt will be discharged from therapy if:: Discharged Treatment plan/goals/alternatives discussed and agreed upon by:: Patient/family  Discharge Summary Short term goals set: See patient care plan Short term goals met: Not met Reason goals not met: Pt did not attend group sessions. Therapeutic equipment acquired: N/A Reason patient discharged from therapy: Discharge from hospital Pt/family agrees with progress & goals achieved: Yes Date patient discharged from therapy: 03/22/19    Victorino Sparrow, LRT/CTRS  Ria Comment, Clemens Lachman A 03/22/2019, 11:31 AM

## 2019-03-22 NOTE — [facility] Suicide Risk Assessment (Signed)
[facility] Discharge Suicide Risk Assessment   Principal Problem: Exacerbation of psychotic disorder Discharge Diagnoses: Active Problems:   Schizoaffective disorder ([place])   Alcohol abuse with intoxication ([place])   Total Time spent with patient: 45 minutes  Musculoskeletal: Strength & Muscle Tone: within normal limits Gait & Station: normal Patient leans: N/A  Psychiatric Specialty Exam: Review of Systems  Blood pressure 115/80, pulse 65, temperature 98 F (36.7 C), temperature source Oral, resp. rate 18, height 5\' 10"  (1.778 m), weight 98 kg.Body mass index is 30.99 kg/m.  General Appearance: Casual  Eye Contact::  Good  Speech:  Clear and Coherent409  Volume:  Normal  Mood:  Euthymic  Affect:  Restricted  Thought Process:  Coherent and Linear  Orientation:  Full (Time, Place, and Person)  Thought Content:  Logical  Suicidal Thoughts:  No  Homicidal Thoughts:  No  Memory:  Immediate;   Good Recent;   Good Remote;   Good  Judgement:  Intact  Insight:  Good  Psychomotor Activity:  Normal  Concentration:  Good  Recall:  Good  Fund of Knowledge:Good  Language: Good  Akathisia:  Negative  Handed:  Right  AIMS (if indicated):     Assets:  Communication Skills Desire for Improvement  Sleep:  Number of Hours: 6.25  Cognition: WNL  ADL's:  Intact   Mental Status Per Nursing Assessment::   On Admission:  NA  Demographic Factors:  Male  Loss Factors: NA  Historical Factors: NA  Risk Reduction Factors:   Religious beliefs about death, Employed and Positive therapeutic relationship  Continued Clinical Symptoms:  Previous Psychiatric Diagnoses and Treatments  Cognitive Features That Contribute To Risk:  None    Suicide Risk:  Minimal: No identifiable suicidal ideation.  Patients presenting with no risk factors but with morbid ruminations; may be classified as minimal risk based on the severity of the depressive symptoms    Plan Of Care/Follow-up  recommendations:  Activity:  full  Liborio Saccente, MD 03/22/2019, 9:04 AM
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
## 2021-04-24 ENCOUNTER — Emergency Department (HOSPITAL_COMMUNITY)
Admission: EM | Admit: 2021-04-24 | Discharge: 2021-04-26 | Disposition: A | Discharge status: Nursing Home (Includes ICF) | Payer: Self-pay | Attending: Emergency Medicine | Admitting: Emergency Medicine

## 2021-04-24 ENCOUNTER — Other Ambulatory Visit: Payer: Self-pay

## 2021-04-24 ENCOUNTER — Encounter (HOSPITAL_COMMUNITY): Payer: Self-pay

## 2021-04-24 DIAGNOSIS — F10920 Alcohol use, unspecified with intoxication, uncomplicated: Secondary | ICD-10-CM

## 2021-04-24 DIAGNOSIS — Z79899 Other long term (current) drug therapy: Secondary | ICD-10-CM | POA: Insufficient documentation

## 2021-04-24 DIAGNOSIS — F1924 Other psychoactive substance dependence with psychoactive substance-induced mood disorder: Secondary | ICD-10-CM | POA: Insufficient documentation

## 2021-04-24 DIAGNOSIS — F259 Schizoaffective disorder, unspecified: Secondary | ICD-10-CM | POA: Insufficient documentation

## 2021-04-24 DIAGNOSIS — Y908 Blood alcohol level of 240 mg/100 ml or more: Secondary | ICD-10-CM | POA: Insufficient documentation

## 2021-04-24 DIAGNOSIS — F1020 Alcohol dependence, uncomplicated: Secondary | ICD-10-CM | POA: Diagnosis present

## 2021-04-24 DIAGNOSIS — F1994 Other psychoactive substance use, unspecified with psychoactive substance-induced mood disorder: Secondary | ICD-10-CM

## 2021-04-24 DIAGNOSIS — Z20822 Contact with and (suspected) exposure to covid-19: Secondary | ICD-10-CM | POA: Insufficient documentation

## 2021-04-24 LAB — CBC WITH DIFFERENTIAL/PLATELET
Abs Immature Granulocytes: 0.02 10*3/uL (ref 0.00–0.07)
Basophils Absolute: 0 10*3/uL (ref 0.0–0.1)
Basophils Relative: 1 %
Eosinophils Absolute: 0.1 10*3/uL (ref 0.0–0.5)
Eosinophils Relative: 1 %
HCT: 48.6 % (ref 39.0–52.0)
Hemoglobin: 17.2 g/dL — ABNORMAL HIGH (ref 13.0–17.0)
Immature Granulocytes: 0 %
Lymphocytes Relative: 56 %
Lymphs Abs: 3.9 10*3/uL (ref 0.7–4.0)
MCH: 33.3 pg (ref 26.0–34.0)
MCHC: 35.4 g/dL (ref 30.0–36.0)
MCV: 94 fL (ref 80.0–100.0)
Monocytes Absolute: 0.5 10*3/uL (ref 0.1–1.0)
Monocytes Relative: 8 %
Neutro Abs: 2.3 10*3/uL (ref 1.7–7.7)
Neutrophils Relative %: 34 %
Platelets: 332 10*3/uL (ref 150–400)
RBC: 5.17 MIL/uL (ref 4.22–5.81)
RDW: 13.5 % (ref 11.5–15.5)
WBC: 6.9 10*3/uL (ref 4.0–10.5)
nRBC: 0 % (ref 0.0–0.2)

## 2021-04-24 LAB — COMPREHENSIVE METABOLIC PANEL
ALT: 36 U/L (ref 0–44)
AST: 30 U/L (ref 15–41)
Albumin: 4.6 g/dL (ref 3.5–5.0)
Alkaline Phosphatase: 126 U/L (ref 38–126)
Anion gap: 13 (ref 5–15)
BUN: 8 mg/dL (ref 6–20)
CO2: 25 mmol/L (ref 22–32)
Calcium: 9.2 mg/dL (ref 8.9–10.3)
Chloride: 103 mmol/L (ref 98–111)
Creatinine, Ser: 0.98 mg/dL (ref 0.61–1.24)
GFR, Estimated: >60 mL/min (ref >60)
Glucose, Bld: 98 mg/dL (ref 70–99)
Potassium: 3.5 mmol/L (ref 3.5–5.1)
Sodium: 141 mmol/L (ref 135–145)
Total Bilirubin: 0.5 mg/dL (ref 0.3–1.2)
Total Protein: 8 g/dL (ref 6.5–8.1)

## 2021-04-24 LAB — RESP PANEL BY RT-PCR (FLU A&B, COVID) ARPGX2
Influenza A by PCR: NEGATIVE
Influenza B by PCR: NEGATIVE
SARS Coronavirus 2 by RT PCR: NEGATIVE

## 2021-04-24 LAB — ETHANOL: Alcohol, Ethyl (B): 314 mg/dL (ref <10)

## 2021-04-24 MED ORDER — STERILE WATER FOR INJECTION IJ SOLN
INTRAMUSCULAR | Status: AC
  Filled 2021-04-24: qty 10

## 2021-04-24 MED ORDER — ZIPRASIDONE MESYLATE 20 MG IM SOLR
20.0000 mg | Freq: Once | INTRAMUSCULAR | Status: AC
Start: 2021-04-24 — End: 2021-04-24
  Administered 2021-04-24: 20 mg via INTRAMUSCULAR
  Filled 2021-04-24: qty 20

## 2021-04-24 NOTE — ED Notes (Signed)
Pt belongings placed in 16-18 cabinets

## 2021-04-24 NOTE — ED Notes (Signed)
Pt wanded by security. 

## 2021-04-24 NOTE — ED Notes (Signed)
Pt belongings placed in bag, labeled with pt's name. Contents: 1 pair black shoes, 1 pair khaki pants with black belt, 1 pair white socks, 2 black shirts.

## 2021-04-24 NOTE — ED Triage Notes (Signed)
Pt. BIB [facility]. He was IVC'd by his sister for alcohol and cocaine use. He has a history of Bipolar and paranoid schizophrenia. Has not been taking meds for 2 months Hasn't eaten or slept in 4 days.
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
## 2021-04-25 LAB — RAPID URINE DRUG SCREEN, HOSP PERFORMED
Amphetamines: NOT DETECTED
Barbiturates: NOT DETECTED
Benzodiazepines: NOT DETECTED
Cocaine: POSITIVE — AB
Opiates: NOT DETECTED
Tetrahydrocannabinol: POSITIVE — AB

## 2021-04-25 LAB — VALPROIC ACID LEVEL: Valproic Acid Lvl: <10 ug/mL — ABNORMAL LOW (ref 50.0–100.0)

## 2021-04-25 MED ORDER — BENZTROPINE MESYLATE 1 MG PO TABS
1.0000 mg | ORAL_TABLET | Freq: Two times a day (BID) | ORAL | Status: DC
  Administered 2021-04-25 – 2021-04-26 (×3): 1 mg via ORAL
  Filled 2021-04-25 (×3): qty 1

## 2021-04-25 MED ORDER — LORAZEPAM 2 MG/ML IJ SOLN: 0.0000 mg | Freq: Four times a day (QID) | INTRAMUSCULAR | Status: DC

## 2021-04-25 MED ORDER — HALOPERIDOL 5 MG PO TABS: 5.0000 mg | ORAL_TABLET | Freq: Every evening | ORAL | Status: DC | PRN

## 2021-04-25 MED ORDER — LORAZEPAM 1 MG PO TABS: 0.0000 mg | ORAL_TABLET | Freq: Two times a day (BID) | ORAL | Status: DC

## 2021-04-25 MED ORDER — THIAMINE HCL 100 MG PO TABS
100.0000 mg | ORAL_TABLET | Freq: Every day | ORAL | Status: DC
  Administered 2021-04-25 – 2021-04-26 (×2): 100 mg via ORAL
  Filled 2021-04-25 (×2): qty 1

## 2021-04-25 MED ORDER — DIVALPROEX SODIUM 250 MG PO DR TAB
250.0000 mg | DELAYED_RELEASE_TABLET | Freq: Two times a day (BID) | ORAL | Status: DC
  Administered 2021-04-25 – 2021-04-26 (×3): 250 mg via ORAL
  Filled 2021-04-25 (×3): qty 1

## 2021-04-25 MED ORDER — THIAMINE HCL 100 MG/ML IJ SOLN: 100.0000 mg | Freq: Every day | INTRAMUSCULAR | Status: DC

## 2021-04-25 MED ORDER — TRAZODONE HCL 100 MG PO TABS
50.0000 mg | ORAL_TABLET | Freq: Every day | ORAL | Status: DC
  Administered 2021-04-25: 50 mg via ORAL
  Filled 2021-04-25: qty 1

## 2021-04-25 MED ORDER — LORAZEPAM 1 MG PO TABS: 0.0000 mg | ORAL_TABLET | Freq: Four times a day (QID) | ORAL | Status: DC

## 2021-04-25 MED ORDER — LORAZEPAM 2 MG/ML IJ SOLN: 0.0000 mg | Freq: Two times a day (BID) | INTRAMUSCULAR | Status: DC
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
## 2021-04-25 NOTE — ED Notes (Signed)
TTS at bedside. 

## 2021-04-25 NOTE — BH Assessment (Signed)
TTS is ready to see the Pt in [place]18, if you can put them in a private room.

## 2021-04-25 NOTE — ED Notes (Signed)
Patient using the phone.  

## 2021-04-25 NOTE — ED Notes (Addendum)
Pt requested that his fiancee Lawanna Kobus [phone]) be added to the chart and contacted for updates

## 2021-04-25 NOTE — Progress Notes (Signed)
Per Elpidio Galea, patient meets criteria for inpatient treatment. There are no available or appropriate beds at [facility] today. CSW faxed referrals to the following facilities for review:  [facility]  Pending - No Request Sent N/A [address], [place] [address] [phone] --  [place] No Request Sent N/A [address] [place] [address] [phone] --  [place]  Pending - No Request Sent N/A [address] [place] [address] [phone] --  [facility]  Pending - No Request Sent N/A [address] [place] [address] [phone] --  [address] [place] [address] [phone] --  [place]  Pending - No Request Sent N/A [address] [place] [address] [id number] [phone] --  [facility]  Pending - No Request Sent N/A [address] [place], [place] [address] [phone] --  [facility]  Pending - No Request Sent N/A [address] [place] [address] [place] --  [facility]  Pending - No Request Sent N/A [address] [place] [address] [facility]  Pending - No Request Sent N/A [address] [place] [address] [phone] --  [facility]  Pending - No Request Sent N/A [id number] Jeanene Erb [place] [address] [phone] --  [place]  Pending - No Request Sent N/A [address] [place] [address] [id number] [phone] --  [place]  Pending - No Request Sent N/A [address], [place] [address] [phone] --  [facility]  Pending -  No Request Sent N/A [address] [place] [address] [id number] --  [place]  Pending - No Request Sent N/A [place]  [address] [phone] --  [facility]  Pending - No Request Sent N/A [address], [place] [address] [id number] --  [facility]  Pending - No Request Sent N/A [address] [place]  [address] [id number] [phone] --    TTS will continue to seek bed placement.  [place], MSW, [place], LCAS-A Phone: [phone] Disposition/TOC

## 2021-04-25 NOTE — BH Assessment (Signed)
Per RN, Pt received medication and is currently too somnolent to complete assessment. Pt will be assessed when he is able to participate.   Pamalee Leyden, [facility], [facility] Triage Specialist [phone]

## 2021-04-26 ENCOUNTER — Other Ambulatory Visit: Payer: Self-pay

## 2021-04-26 ENCOUNTER — Other Ambulatory Visit (HOSPITAL_COMMUNITY)
Admission: EM | Admit: 2021-04-26 | Discharge: 2021-04-27 | Disposition: A | Discharge status: Home / Self Care | Payer: No Payment, Other | Attending: Family | Admitting: Family

## 2021-04-26 ENCOUNTER — Encounter (HOSPITAL_COMMUNITY): Payer: Self-pay | Admitting: Family

## 2021-04-26 DIAGNOSIS — F203 Undifferentiated schizophrenia: Secondary | ICD-10-CM

## 2021-04-26 DIAGNOSIS — F1994 Other psychoactive substance use, unspecified with psychoactive substance-induced mood disorder: Secondary | ICD-10-CM | POA: Diagnosis not present

## 2021-04-26 DIAGNOSIS — Z560 Unemployment, unspecified: Secondary | ICD-10-CM | POA: Insufficient documentation

## 2021-04-26 DIAGNOSIS — F1490 Cocaine use, unspecified, uncomplicated: Secondary | ICD-10-CM | POA: Insufficient documentation

## 2021-04-26 DIAGNOSIS — F200 Paranoid schizophrenia: Secondary | ICD-10-CM | POA: Insufficient documentation

## 2021-04-26 DIAGNOSIS — F1290 Cannabis use, unspecified, uncomplicated: Secondary | ICD-10-CM | POA: Insufficient documentation

## 2021-04-26 DIAGNOSIS — F1020 Alcohol dependence, uncomplicated: Secondary | ICD-10-CM

## 2021-04-26 DIAGNOSIS — F17210 Nicotine dependence, cigarettes, uncomplicated: Secondary | ICD-10-CM | POA: Insufficient documentation

## 2021-04-26 DIAGNOSIS — F1090 Alcohol use, unspecified, uncomplicated: Secondary | ICD-10-CM | POA: Diagnosis not present

## 2021-04-26 DIAGNOSIS — T443X6A Underdosing of other parasympatholytics [anticholinergics and antimuscarinics] and spasmolytics, initial encounter: Secondary | ICD-10-CM | POA: Insufficient documentation

## 2021-04-26 DIAGNOSIS — Z9114 Patient's other noncompliance with medication regimen: Secondary | ICD-10-CM | POA: Insufficient documentation

## 2021-04-26 DIAGNOSIS — T434X6A Underdosing of butyrophenone and thiothixene neuroleptics, initial encounter: Secondary | ICD-10-CM | POA: Insufficient documentation

## 2021-04-26 DIAGNOSIS — Z811 Family history of alcohol abuse and dependence: Secondary | ICD-10-CM | POA: Insufficient documentation

## 2021-04-26 MED ORDER — LORAZEPAM 1 MG PO TABS: 1.0000 mg | ORAL_TABLET | Freq: Four times a day (QID) | ORAL | Status: DC | PRN

## 2021-04-26 MED ORDER — BENZTROPINE MESYLATE 1 MG PO TABS: 1.0000 mg | ORAL_TABLET | Freq: Two times a day (BID) | ORAL | Status: DC

## 2021-04-26 MED ORDER — TRAZODONE HCL 50 MG PO TABS
50.0000 mg | ORAL_TABLET | Freq: Every day | ORAL | Status: DC
  Administered 2021-04-26: 50 mg via ORAL
  Filled 2021-04-26: qty 7
  Filled 2021-04-26: qty 1

## 2021-04-26 MED ORDER — HALOPERIDOL 5 MG PO TABS
5.0000 mg | ORAL_TABLET | Freq: Every day | ORAL | Status: DC
  Administered 2021-04-26: 5 mg via ORAL
  Filled 2021-04-26: qty 1
  Filled 2021-04-26: qty 7

## 2021-04-26 MED ORDER — LORAZEPAM 2 MG/ML IJ SOLN: 0.0000 mg | Freq: Four times a day (QID) | INTRAMUSCULAR | Status: DC

## 2021-04-26 MED ORDER — LORAZEPAM 1 MG PO TABS: 0.0000 mg | ORAL_TABLET | Freq: Two times a day (BID) | ORAL | Status: DC

## 2021-04-26 MED ORDER — BENZTROPINE MESYLATE 0.5 MG PO TABS
0.5000 mg | ORAL_TABLET | Freq: Two times a day (BID) | ORAL | Status: DC
  Administered 2021-04-26 – 2021-04-27 (×2): 0.5 mg via ORAL
  Filled 2021-04-26: qty 14
  Filled 2021-04-26 (×2): qty 1

## 2021-04-26 MED ORDER — HALOPERIDOL 5 MG PO TABS: 5.0000 mg | ORAL_TABLET | Freq: Two times a day (BID) | ORAL | Status: DC

## 2021-04-26 MED ORDER — THIAMINE HCL 100 MG PO TABS
100.0000 mg | ORAL_TABLET | Freq: Every day | ORAL | Status: DC
  Administered 2021-04-27: 10:00:00 100 mg via ORAL
  Filled 2021-04-26: qty 1

## 2021-04-26 MED ORDER — LORAZEPAM 1 MG PO TABS: 0.0000 mg | ORAL_TABLET | Freq: Four times a day (QID) | ORAL | Status: DC

## 2021-04-26 MED ORDER — LORAZEPAM 2 MG/ML IJ SOLN: 0.0000 mg | Freq: Two times a day (BID) | INTRAMUSCULAR | Status: DC

## 2021-04-26 MED ORDER — HALOPERIDOL 5 MG PO TABS: 5.0000 mg | ORAL_TABLET | Freq: Every evening | ORAL | Status: DC | PRN

## 2021-04-26 MED ORDER — ADULT MULTIVITAMIN W/MINERALS CH
1.0000 | ORAL_TABLET | Freq: Every day | ORAL | Status: DC
  Administered 2021-04-26 – 2021-04-27 (×2): 1 via ORAL
  Filled 2021-04-26 (×2): qty 1

## 2021-04-26 MED ORDER — ONDANSETRON 4 MG PO TBDP: 4.0000 mg | ORAL_TABLET | Freq: Four times a day (QID) | ORAL | Status: DC | PRN

## 2021-04-26 MED ORDER — LOPERAMIDE HCL 2 MG PO CAPS: 2.0000 mg | ORAL_CAPSULE | ORAL | Status: DC | PRN

## 2021-04-26 MED ORDER — HYDROXYZINE HCL 25 MG PO TABS: 25.0000 mg | ORAL_TABLET | Freq: Four times a day (QID) | ORAL | Status: DC | PRN

## 2021-04-26 MED ORDER — HALOPERIDOL 5 MG PO TABS: 5.0000 mg | ORAL_TABLET | Freq: Every day | ORAL | Status: DC

## 2021-04-26 NOTE — ED Notes (Signed)
Pt given snack; peanut butter and graham crackers with a juice. Will continue to monitor pt

## 2021-04-26 NOTE — ED Notes (Signed)
Pt sleeping@this time. Breathing even and unlabored. Will continue to monitor for safety 

## 2021-04-26 NOTE — ED Notes (Signed)
Pt is calm but he is pacing the unit with his arms inside his shirt.

## 2021-04-26 NOTE — ED Notes (Signed)
Patient expressed in wrap up group that he is ready to leave, and will seek out out patient help once he is discharged. Staff commended patient participating in group and giving positive feedback.

## 2021-04-26 NOTE — Progress Notes (Signed)
Received Antonio Mccoy from [facility] at 1500 hrs to the [facility]. He completed the required paperwork and was oriented to his new environment. He  received nourishments per his request and ate a ham and cheese sandwich chips, fruit and orange juice. He denied all of the psychiatric symptoms including feeling suicidal. Later he made several phone calls.
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
## 2021-04-26 NOTE — ED Notes (Signed)
Pt on phone calm and cooperative. Pt was using profanity and was asked to stop and he did. No c/o pain or distress. Will continue to monitor for safety

## 2021-04-26 NOTE — ED Notes (Signed)
Pt requested to make his phone call. Pt told about the rules of using the phone; phone calls begin after 10a and only 3 five minute phone calls. Pt complained but remained cooperative. Pt made his first phone call of the day. Will continue to monitor pt

## 2021-04-26 NOTE — ED Notes (Signed)
Pt woke up and requested to brush his teeth. This Clinical research associate offered if pt would like to take a shower, pt agreed. Pt walked to TCU and showered there. Pt told of rules and was compliant. Pt back on his bed, bed linen changed, breakfast on bedside table and 4 word searches printed out for him. Will continue to monitor pt

## 2021-04-26 NOTE — ED Notes (Signed)
Pt's lunch has arrived. Pt sitting up and eating his lunch 

## 2021-04-26 NOTE — BH Assessment (Signed)
[facility] Assessment Progress Note   Per Caryn Bee, NP , this pt does not require psychiatric hospitalization at this time, but would benefit from admission to Facility Based Crisis.  Pt presents under IVC  which has been rescinded by Nelly Rout, MD.  Earlene Plater, MD has agreed to accept pt to [facility].  Pt has signed Voluntary Admission and Consent for Treatment, as well as Consent to Release Information to the [employer] crisis counselor, to pt's fiancee, and pt's sister, and signed forms have been faxed to [facility].  A notification call has been placed to the crisis counselor.  EDP Gwyneth Sprout, MD and pt's nurse, Destiny, have been notified, and Destiny agrees to send original paperwork along with pt via Safe Transport, and to call report to [phone].  Doylene Canning, [place] Behavioral Health Coordinator [phone]
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
## 2021-04-26 NOTE — ED Notes (Signed)
Pt used his second phone call, pt back on his stretcher. Will continue to monitor pt

## 2021-04-26 NOTE — ED Notes (Signed)
Pt has armband in  his folder due to armband he has on his wrist is not is not scanning for medications

## 2021-04-27 DIAGNOSIS — F1090 Alcohol use, unspecified, uncomplicated: Secondary | ICD-10-CM | POA: Diagnosis not present

## 2021-04-27 DIAGNOSIS — F1994 Other psychoactive substance use, unspecified with psychoactive substance-induced mood disorder: Secondary | ICD-10-CM | POA: Diagnosis not present

## 2021-04-27 DIAGNOSIS — F200 Paranoid schizophrenia: Secondary | ICD-10-CM | POA: Diagnosis not present

## 2021-04-27 DIAGNOSIS — F1490 Cocaine use, unspecified, uncomplicated: Secondary | ICD-10-CM | POA: Diagnosis not present

## 2021-04-27 MED ORDER — HALOPERIDOL 5 MG PO TABS: 5.0000 mg | ORAL_TABLET | Freq: Every day | ORAL | 0 refills | Status: DC

## 2021-04-27 MED ORDER — TRAZODONE HCL 50 MG PO TABS: 50.0000 mg | ORAL_TABLET | Freq: Every day | ORAL | 0 refills | Status: DC

## 2021-04-27 MED ORDER — BENZTROPINE MESYLATE 0.5 MG PO TABS: 0.5000 mg | ORAL_TABLET | Freq: Two times a day (BID) | ORAL | 0 refills | Status: DC

## 2021-04-27 NOTE — Group Note (Signed)
Group Topic: Understanding Self  Group Date: 04/27/2021 Start Time: 1015 End Time: 1100 Facilitators: Levander Campion  Department: [facility]  Number of Participants: 3  Group Focus: feeling awareness/expression, relaxation, and self-awareness Treatment Modality:  Cognitive Behavioral Therapy and Individual Therapy Interventions utilized were patient education Purpose: enhance coping skills  Name: Antonio Mccoy Date of Birth: 1975/09/10  MR: [id number]    Level of Participation: active Quality of Participation: cooperative Interactions with others: gave feedback Mood/Affect: appropriate Triggers (if applicable): n/a Cognition: coherent/clear Progress: Moderate Response: n/a Plan: follow-up needed  Patients Problems:  Patient Active Problem List   Diagnosis Date Noted   Substance induced mood disorder (HCC) 04/26/2021   Alcohol abuse with intoxication (HCC) 03/21/2019   Schizoaffective disorder (HCC) 03/20/2019   Alcohol use disorder, severe, dependence (HCC) 04/19/2015   Alcohol withdrawal (HCC) 04/19/2015   Tobacco use disorder 04/19/2015   Undifferentiated schizophrenia (HCC)    Ankle fracture 02/02/2015

## 2021-04-27 NOTE — ED Notes (Signed)
Pt went to restroom, and then he paced the unit then went back to his room

## 2021-04-27 NOTE — Clinical Social Work Psych Note (Incomplete)
LCSW Initial/Discharge Note   LCSW met with Antonio Mccoy for introduction and to begin discussions regarding treatment and potential discharge.   LCSW read per chart, the patient was requesting to discharge.    During this assessment, Antonio Mccoy was pleasant and cooperative with speaking with LCSW. Antonio Mccoy reports that he was transferred to the [facility] after being IVC'd by his sister to "get back on track with meds and stuff". Antonio Mccoy reports that he is requesting to discharge. He reports he feels ready to return home with his girlfriend now that he has his proper medications.   Antonio Mccoy was agreeable with following up with [facility] for medication management and therapy services. He also requested addition resources for financial assistance.    Antonio Mccoy reports he will need transportation assistance at [employer].

## 2021-04-27 NOTE — ED Notes (Signed)
Safe transport called 

## 2021-04-27 NOTE — ED Notes (Signed)
Pt laying down in is bed resting@this  time. Breathing even and unlabored. Will continue to monitor for safety

## 2021-04-27 NOTE — ED Notes (Signed)
Patient A&Ox4. Denies intent to harm self/others when asked. Denies A/VH. Patient denies any physical complaints when asked. No acute distress noted. Pt request to discharge today from facility. MD made aware. Support and encouragement provided. Routine safety checks conducted according to facility protocol. Encouraged patient to notify staff if thoughts of harm toward self or others arise. Patient verbalize understanding and agreement. Patient remains safe and patient verbally contracts for safety. Will continue to monitor.

## 2021-04-27 NOTE — ED Notes (Signed)
Pt pacing the unit , frustrated saying he wants to go home.
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
## 2021-04-27 NOTE — Discharge Instructions (Signed)

## 2021-04-27 NOTE — ED Notes (Signed)
Patient A&O x 4, ambulatory. Patient discharged in no acute distress. Patient denied SI/HI, A/VH upon discharge. Patient verbalized understanding of all discharge instructions explained by staff, to include follow up appointments, RX's and safety plan. Patient reported mood 10/10.  Pt belongings returned to patient from locker #11 intact. Patient escorted to [place] via staff for transport to destination via safe transport. Safety maintained.

## 2021-05-06 ENCOUNTER — Other Ambulatory Visit: Payer: Self-pay

## 2021-05-06 ENCOUNTER — Ambulatory Visit (INDEPENDENT_AMBULATORY_CARE_PROVIDER_SITE_OTHER): Payer: No Payment, Other | Admitting: Physician Assistant

## 2021-05-06 ENCOUNTER — Encounter (HOSPITAL_COMMUNITY): Payer: Self-pay | Admitting: Physician Assistant

## 2021-05-06 VITALS — BP 106/69 | HR 53 | Ht 70.0 in | Wt 213.0 lb

## 2021-05-06 DIAGNOSIS — F1010 Alcohol abuse, uncomplicated: Secondary | ICD-10-CM | POA: Diagnosis not present

## 2021-05-06 DIAGNOSIS — F99 Mental disorder, not otherwise specified: Secondary | ICD-10-CM | POA: Insufficient documentation

## 2021-05-06 DIAGNOSIS — F5105 Insomnia due to other mental disorder: Secondary | ICD-10-CM | POA: Diagnosis not present

## 2021-05-06 DIAGNOSIS — F203 Undifferentiated schizophrenia: Secondary | ICD-10-CM

## 2021-05-06 MED ORDER — TRAZODONE HCL 50 MG PO TABS
50.0000 mg | ORAL_TABLET | Freq: Every day | ORAL | 1 refills | Status: DC
  Filled 2021-05-06: qty 30, 30d supply, fill #0
  Filled 2021-06-02: qty 30, 30d supply, fill #1

## 2021-05-06 MED ORDER — DIVALPROEX SODIUM 500 MG PO DR TAB
500.0000 mg | DELAYED_RELEASE_TABLET | Freq: Two times a day (BID) | ORAL | 1 refills | Status: DC
  Filled 2021-05-06: qty 60, 30d supply, fill #0
  Filled 2021-06-02: qty 60, 30d supply, fill #1

## 2021-05-06 MED ORDER — HALOPERIDOL 5 MG PO TABS: 5.0000 mg | ORAL_TABLET | Freq: Every day | ORAL | 1 refills | Status: DC

## 2021-05-06 MED ORDER — DIVALPROEX SODIUM 500 MG PO DR TAB: 500.0000 mg | DELAYED_RELEASE_TABLET | Freq: Two times a day (BID) | ORAL | 1 refills | Status: DC

## 2021-05-06 MED ORDER — BENZTROPINE MESYLATE 0.5 MG PO TABS: 0.5000 mg | ORAL_TABLET | Freq: Two times a day (BID) | ORAL | 1 refills | Status: DC

## 2021-05-06 MED ORDER — TRAZODONE HCL 50 MG PO TABS: 50.0000 mg | ORAL_TABLET | Freq: Every day | ORAL | 1 refills | Status: DC

## 2021-05-06 MED ORDER — BENZTROPINE MESYLATE 0.5 MG PO TABS
0.5000 mg | ORAL_TABLET | Freq: Two times a day (BID) | ORAL | 1 refills | Status: AC
  Filled 2021-05-06: qty 60, 30d supply, fill #0
  Filled 2021-06-02: qty 60, 30d supply, fill #1

## 2021-05-06 MED ORDER — HALOPERIDOL 5 MG PO TABS
5.0000 mg | ORAL_TABLET | Freq: Every day | ORAL | 1 refills | Status: DC
  Filled 2021-05-06: qty 30, 30d supply, fill #0
  Filled 2021-06-02: qty 30, 30d supply, fill #1
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
## 2021-06-02 ENCOUNTER — Other Ambulatory Visit: Payer: Self-pay

## 2021-06-22 ENCOUNTER — Telehealth (HOSPITAL_COMMUNITY): Payer: No Payment, Other | Admitting: Physician Assistant

## 2021-10-27 ENCOUNTER — Encounter (HOSPITAL_COMMUNITY): Payer: Self-pay

## 2021-10-27 ENCOUNTER — Emergency Department (HOSPITAL_COMMUNITY)
Admission: EM | Admit: 2021-10-27 | Discharge: 2021-10-27 | Disposition: A | Discharge status: Home / Self Care | Payer: Self-pay | Attending: Emergency Medicine | Admitting: Emergency Medicine

## 2021-10-27 DIAGNOSIS — W540XXA Bitten by dog, initial encounter: Secondary | ICD-10-CM | POA: Insufficient documentation

## 2021-10-27 DIAGNOSIS — S60464A Insect bite (nonvenomous) of right ring finger, initial encounter: Secondary | ICD-10-CM | POA: Insufficient documentation

## 2021-10-27 MED ORDER — AMOXICILLIN-POT CLAVULANATE 875-125 MG PO TABS: 1.0000 | ORAL_TABLET | Freq: Two times a day (BID) | ORAL | 0 refills | Status: DC

## 2021-10-27 NOTE — ED Provider Notes (Signed)
[facility]-EMERGENCY DEPT Provider Note   CSN: [id number] Arrival date & time: 10/27/21  1432     History  Chief Complaint  Patient presents with   Animal Bite   Insect Bite    Antonio Mccoy is a 46 y.o. male who presents to the Emergency Department complaining of an animal bite onset 2 days ago.  Patient notes that his neighbors dog bit him to being right lower extremity.  Patient spoke with his neighbor who noted that the dog was up-to-date with his vaccinations.  No meds tried prior to arrival.  Denies color change, rash, swelling, fever, chills.  Patient is unsure of his tetanus status.  Denies allergies to medications.   Patient also complaining of a possible bee sting to his right ring finger onset 5 hours ago.  No meds tried prior to arrival.  Notes that he saw a bee to the area.  Patient notes tingling sensation to the right hand.  Denies fever, redness, swelling.  The history is provided by the patient. No language interpreter was used.       Home Medications Prior to Admission medications   Medication Sig Start Date End Date Taking? Authorizing Provider  benztropine (COGENTIN) 0.5 MG tablet Take 1 tablet (0.5 mg total) by mouth 2 (two) times daily. 05/06/21   Nwoko, Tommas Olp, PA  divalproex (DEPAKOTE) 500 MG DR tablet Take 1 tablet (500 mg total) by mouth 2 (two) times daily. 05/06/21 05/06/22  Meta Hatchet, PA  haloperidol (HALDOL) 5 MG tablet Take 1 tablet (5 mg total) by mouth at bedtime. 05/06/21   Nwoko, Tommas Olp, PA  traZODone (DESYREL) 50 MG tablet Take 1 tablet (50 mg total) by mouth at bedtime. 05/06/21   Meta Hatchet, PA      Allergies    Patient has no known allergies.    Review of Systems   Review of Systems  Constitutional:  Negative for fever.  Gastrointestinal:  Negative for nausea and vomiting.  Skin:  Positive for wound. Negative for color change.  All other systems reviewed and are negative.   Physical Exam Updated  Vital Signs BP 138/89 (BP Location: Left Arm)   Pulse 75   Temp 98.6 F (37 C) (Oral)   Resp 17   Ht 5\' 10"  (1.778 m)   Wt 93 kg   SpO2 96%   BMI 29.41 kg/m  Physical Exam Vitals and nursing note reviewed.  Constitutional:      General: He is not in acute distress.    Appearance: Normal appearance.  Eyes:     General: No scleral icterus.    Extraocular Movements: Extraocular movements intact.  Cardiovascular:     Rate and Rhythm: Normal rate.  Pulmonary:     Effort: Pulmonary effort is normal. No respiratory distress.  Abdominal:     Palpations: Abdomen is soft. There is no mass.     Tenderness: There is no abdominal tenderness.  Musculoskeletal:        General: Normal range of motion.     Cervical back: Neck supple.     Comments: Mild tenderness to palpation noted to right ring finger distal phalanx.  No overlying skin changes.  Normal resisted flexion and extension of right ring finger.  Radial pulse intact.  Grip strength 5/5 bilaterally.  Sensation intact bilaterally to upper extremities.  No tenderness to palpation noted to right olecranon.  No overlying skin changes to bilateral upper extremities.  No swelling appreciated.  Skin:    General: Skin is warm and dry.     Findings: No rash.     Comments: No appreciable erythema or drainage noted to the right lateral distal phalanx of right fourth finger.   Neurological:     Mental Status: He is alert.     Sensory: Sensation is intact.     Motor: Motor function is intact.  Psychiatric:        Behavior: Behavior normal.     ED Results / Procedures / Treatments   Labs (all labs ordered are listed, but only abnormal results are displayed) Labs Reviewed - No data to display  EKG None  Radiology No results found.  Procedures Procedures    Medications Ordered in ED Medications - No data to display  ED Course/ Medical Decision Making/ A&P Clinical Course as of 10/27/21 2210  Wed Oct 27, 2021  1737 Discussed  discharge treatment plan. Pt agreeable at this time.  [SB]    Clinical Course User Index [SB] Davius Goudeau A, PA-C                           Medical Decision Making Risk Prescription drug management.   Pt presents with concerns for dog bite onset 2 days ago to the right lower cath.  Also concerns for insect bite left, likely wasp to the right ring finger.  No meds tried prior to arrival.  Patient afebrile.  On exam patient with mild tenderness to palpation noted to right ring finger distal phalanx.  No overlying skin changes.  Normal resisted flexion and extension of right ring finger.  Radial pulse intact.  Grip strength 5/5 bilaterally.  Sensation intact bilaterally to upper extremities.  No tenderness to palpation noted to right olecranon.  No overlying skin changes to bilateral upper extremities.  No swelling appreciated. No appreciable erythema or drainage noted to the right lateral distal phalanx of right fourth finger. Differential diagnosis includes abscess, cellulitis, animal bite.    Disposition: Presentation suspicious for animal bite as well as possible insect bite.  Doubt abscess, cellulitis at this time.  Tetanus up-to-date as of 2016, dog is neighbors dog and per neighbor dog is up-to-date with her vaccinations, doubt need for rabies vaccinations at this time in the emergency department. After consideration of the diagnostic results and the patients response to treatment, I feel that the patient would benefit from Discharge home.  Patient will be discharged home with a prescription for Augmentin.  Supportive care measures and strict return precautions discussed with patient at bedside. Pt acknowledges and verbalizes understanding. Pt appears safe for discharge. Follow up as indicated in discharge paperwork.    This chart was dictated using voice recognition software, Dragon. Despite the best efforts of this provider to proofread and correct errors, errors may still occur which can  change documentation meaning.  Final Clinical Impression(s) / ED Diagnoses Final diagnoses:  Insect bite of right ring finger, initial encounter  Dog bite, initial encounter    Rx / DC Orders ED Discharge Orders          Ordered    amoxicillin-clavulanate (AUGMENTIN) 875-125 MG tablet  Every 12 hours        10/27/21 1713              Nadege Carriger A, PA-C 10/27/21 2213    Arby Barrette, MD 11/03/21 [phone]

## 2021-10-27 NOTE — ED Notes (Signed)
An After Visit Summary was printed and given to the patient. Discharge instructions given and no further questions at this time.  

## 2021-10-27 NOTE — ED Triage Notes (Signed)
Pt states he was bit by his neighbor's dog two days ago on his left calf. States the neighbor reports it is vaccinated. Pt also got stung by a bee today on his right ring finger. States after the bee sting he began having a numbness to his right arm. No swelling noted. NAD.

## 2021-10-27 NOTE — Discharge Instructions (Addendum)
It was a pleasure taking care of you today!   You will be sent Augmentin (antibiotic) to your pharmacy, completed the antibiotic as prescribed.  You may take over-the-counter 600 mg ibuprofen every 6 hours or 500 mg Tylenol every 6 hours as needed for pain.  You may place ice to the finger for up to 15 minutes at a time, should consider between your skin and the ice.  Ensure that you keep the area to the right lower leg clean and dry.  You may follow-up with your primary care provider as needed.  Return to the emergency department for experiencing increasing/worsening drainage, redness that is spreading, fever, inability to walk, worsening symptoms.

## 2022-03-13 ENCOUNTER — Emergency Department (HOSPITAL_COMMUNITY)
Admission: EM | Admit: 2022-03-13 | Discharge: 2022-03-14 | Disposition: A | Discharge status: Home / Self Care | Payer: No Typology Code available for payment source | Attending: Emergency Medicine | Admitting: Emergency Medicine

## 2022-03-13 ENCOUNTER — Emergency Department (HOSPITAL_COMMUNITY): Payer: No Typology Code available for payment source

## 2022-03-13 ENCOUNTER — Encounter (HOSPITAL_COMMUNITY): Payer: Self-pay

## 2022-03-13 ENCOUNTER — Other Ambulatory Visit: Payer: Self-pay

## 2022-03-13 DIAGNOSIS — S6991XA Unspecified injury of right wrist, hand and finger(s), initial encounter: Secondary | ICD-10-CM | POA: Diagnosis not present

## 2022-03-13 DIAGNOSIS — M25552 Pain in left hip: Secondary | ICD-10-CM | POA: Insufficient documentation

## 2022-03-13 DIAGNOSIS — Z23 Encounter for immunization: Secondary | ICD-10-CM | POA: Insufficient documentation

## 2022-03-13 DIAGNOSIS — Y92410 Unspecified street and highway as the place of occurrence of the external cause: Secondary | ICD-10-CM | POA: Diagnosis not present

## 2022-03-13 DIAGNOSIS — M25512 Pain in left shoulder: Secondary | ICD-10-CM
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
## 2022-03-14 MED ORDER — TETANUS-DIPHTH-ACELL PERTUSSIS 5-2.5-18.5 LF-MCG/0.5 IM SUSY
0.5000 mL | PREFILLED_SYRINGE | Freq: Once | INTRAMUSCULAR | Status: AC
Start: 2022-03-14 — End: 2022-03-14
  Administered 2022-03-14: 0.5 mL via INTRAMUSCULAR
  Filled 2022-03-14: qty 0.5

## 2022-03-14 MED ORDER — CEPHALEXIN 250 MG PO CAPS
500.0000 mg | ORAL_CAPSULE | Freq: Once | ORAL | Status: AC
  Administered 2022-03-14: 500 mg via ORAL
  Filled 2022-03-14: qty 2

## 2022-03-14 MED ORDER — NAPROXEN 500 MG PO TABS: 500.0000 mg | ORAL_TABLET | Freq: Two times a day (BID) | ORAL | 0 refills | Status: DC

## 2022-03-14 MED ORDER — METHOCARBAMOL 500 MG PO TABS: 500.0000 mg | ORAL_TABLET | Freq: Three times a day (TID) | ORAL | 0 refills | Status: DC | PRN

## 2022-03-14 MED ORDER — CEPHALEXIN 500 MG PO CAPS: 500.0000 mg | ORAL_CAPSULE | Freq: Four times a day (QID) | ORAL | 0 refills | Status: DC

## 2022-03-14 MED ORDER — CEPHALEXIN 500 MG PO CAPS
500.0000 mg | ORAL_CAPSULE | Freq: Four times a day (QID) | ORAL | 0 refills | Status: AC
Start: 2022-03-14 — End: 2022-03-19

## 2022-03-14 NOTE — Discharge Instructions (Signed)
You were evaluated in the Emergency Department and after careful evaluation, we did not find any emergent condition requiring admission or further testing in the hospital.  Your exam/testing today is overall reassuring.  X-rays did not show any significant injuries.  Use the Naprosyn twice daily for pain.  Can use the Robaxin for pain keeping you from sleeping but use caution as it can cause drowsiness.  Regarding your finger, we updated your tetanus.  Take the Keflex antibiotic to prevent infection.  Please return to the Emergency Department if you experience any worsening of your condition.   Thank you for allowing [place] to be a part of your care.
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
## 2022-08-10 ENCOUNTER — Other Ambulatory Visit: Payer: Self-pay

## 2022-08-10 ENCOUNTER — Encounter: Payer: Self-pay | Admitting: Emergency Medicine

## 2022-08-10 ENCOUNTER — Emergency Department
Admission: EM | Admit: 2022-08-10 | Discharge: 2022-08-11 | Disposition: A | Discharge status: Home / Self Care | Payer: Self-pay | Attending: Emergency Medicine | Admitting: Emergency Medicine

## 2022-08-10 DIAGNOSIS — F10130 Alcohol abuse with withdrawal, uncomplicated: Secondary | ICD-10-CM | POA: Insufficient documentation

## 2022-08-10 DIAGNOSIS — F1914 Other psychoactive substance abuse with psychoactive substance-induced mood disorder: Secondary | ICD-10-CM | POA: Insufficient documentation

## 2022-08-10 DIAGNOSIS — F259 Schizoaffective disorder, unspecified: Secondary | ICD-10-CM | POA: Diagnosis present

## 2022-08-10 DIAGNOSIS — F1010 Alcohol abuse, uncomplicated: Secondary | ICD-10-CM | POA: Diagnosis present

## 2022-08-10 DIAGNOSIS — F10129 Alcohol abuse with intoxication, unspecified: Secondary | ICD-10-CM | POA: Diagnosis present

## 2022-08-10 DIAGNOSIS — F10939 Alcohol use, unspecified with withdrawal, unspecified: Secondary | ICD-10-CM | POA: Diagnosis present

## 2022-08-10 DIAGNOSIS — Y900 Blood alcohol level of less than 20 mg/100 ml: Secondary | ICD-10-CM | POA: Insufficient documentation

## 2022-08-10 DIAGNOSIS — F17210 Nicotine dependence, cigarettes, uncomplicated: Secondary | ICD-10-CM | POA: Insufficient documentation

## 2022-08-10 DIAGNOSIS — F1994 Other psychoactive substance use, unspecified with psychoactive substance-induced mood disorder: Secondary | ICD-10-CM | POA: Diagnosis present

## 2022-08-10 DIAGNOSIS — F17200 Nicotine dependence, unspecified, uncomplicated: Secondary | ICD-10-CM | POA: Diagnosis present

## 2022-08-10 DIAGNOSIS — F5105 Insomnia due to other mental disorder: Secondary | ICD-10-CM

## 2022-08-10 DIAGNOSIS — F203 Undifferentiated schizophrenia: Secondary | ICD-10-CM | POA: Diagnosis present

## 2022-08-10 DIAGNOSIS — F29 Unspecified psychosis not due to a substance or known physiological condition: Secondary | ICD-10-CM | POA: Insufficient documentation

## 2022-08-10 LAB — CBC
HCT: 45.9 % (ref 39.0–52.0)
Hemoglobin: 15.9 g/dL (ref 13.0–17.0)
MCH: 32.3 pg (ref 26.0–34.0)
MCHC: 34.6 g/dL (ref 30.0–36.0)
MCV: 93.1 fL (ref 80.0–100.0)
Platelets: 213 10*3/uL (ref 150–400)
RBC: 4.93 MIL/uL (ref 4.22–5.81)
RDW: 13.2 % (ref 11.5–15.5)
WBC: 6.3 10*3/uL (ref 4.0–10.5)
nRBC: 0 % (ref 0.0–0.2)

## 2022-08-10 LAB — URINE DRUG SCREEN, QUALITATIVE (ARMC ONLY)
Amphetamines, Ur Screen: NOT DETECTED
Barbiturates, Ur Screen: NOT DETECTED
Benzodiazepine, Ur Scrn: NOT DETECTED
Cannabinoid 50 Ng, Ur ~~LOC~~: POSITIVE — AB
Cocaine Metabolite,Ur ~~LOC~~: POSITIVE — AB
MDMA (Ecstasy)Ur Screen: NOT DETECTED
Methadone Scn, Ur: NOT DETECTED
Opiate, Ur Screen: NOT DETECTED
Phencyclidine (PCP) Ur S: NOT DETECTED
Tricyclic, Ur Screen: NOT DETECTED

## 2022-08-10 LAB — COMPREHENSIVE METABOLIC PANEL
ALT: 60 U/L — ABNORMAL HIGH (ref 0–44)
AST: 105 U/L — ABNORMAL HIGH (ref 15–41)
Albumin: 4.1 g/dL (ref 3.5–5.0)
Alkaline Phosphatase: 89 U/L (ref 38–126)
Anion gap: 12 (ref 5–15)
BUN: 9 mg/dL (ref 6–20)
CO2: 23 mmol/L (ref 22–32)
Calcium: 8.6 mg/dL — ABNORMAL LOW (ref 8.9–10.3)
Chloride: 99 mmol/L (ref 98–111)
Creatinine, Ser: 1.07 mg/dL (ref 0.61–1.24)
GFR, Estimated: >60 mL/min (ref >60)
Glucose, Bld: 114 mg/dL — ABNORMAL HIGH (ref 70–99)
Potassium: 3.5 mmol/L (ref 3.5–5.1)
Sodium: 134 mmol/L — ABNORMAL LOW (ref 135–145)
Total Bilirubin: 1.2 mg/dL (ref 0.3–1.2)
Total Protein: 7.2 g/dL (ref 6.5–8.1)

## 2022-08-10 LAB — ETHANOL: Alcohol, Ethyl (B): <10 mg/dL (ref <10)

## 2022-08-10 LAB — VALPROIC ACID LEVEL: Valproic Acid Lvl: <10 ug/mL — ABNORMAL LOW (ref 50.0–100.0)

## 2022-08-10 LAB — SALICYLATE LEVEL: Salicylate Lvl: <7 mg/dL — ABNORMAL LOW (ref 7.0–30.0)

## 2022-08-10 LAB — ACETAMINOPHEN LEVEL: Acetaminophen (Tylenol), Serum: <10 ug/mL — ABNORMAL LOW (ref 10–30)

## 2022-08-10 MED ORDER — LORAZEPAM 1 MG PO TABS
1.0000 mg | ORAL_TABLET | Freq: Once | ORAL | Status: AC
  Administered 2022-08-10: 1 mg via ORAL
  Filled 2022-08-10: qty 1
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
## 2022-08-11 DIAGNOSIS — F203 Undifferentiated schizophrenia: Secondary | ICD-10-CM

## 2022-08-11 MED ORDER — LORAZEPAM 1 MG PO TABS
1.0000 mg | ORAL_TABLET | Freq: Once | ORAL | Status: AC
  Administered 2022-08-11: 1 mg via ORAL
  Filled 2022-08-11: qty 1

## 2022-08-11 MED ORDER — MIDAZOLAM HCL (PF) 10 MG/2ML IJ SOLN
5.0000 mg | Freq: Once | INTRAMUSCULAR | Status: AC
  Administered 2022-08-11: 5 mg via INTRAMUSCULAR
  Filled 2022-08-11: qty 2

## 2022-08-11 MED ORDER — DROPERIDOL 2.5 MG/ML IJ SOLN
5.0000 mg | Freq: Once | INTRAMUSCULAR | Status: AC
  Administered 2022-08-11: 5 mg via INTRAMUSCULAR
  Filled 2022-08-11: qty 2

## 2022-08-11 MED ORDER — MIDAZOLAM HCL 2 MG/2ML IJ SOLN
5.0000 mg | Freq: Once | INTRAMUSCULAR | Status: AC
  Administered 2022-08-11: 5 mg via INTRAMUSCULAR
  Filled 2022-08-11: qty 6

## 2022-08-11 NOTE — ED Notes (Signed)
Paged the on call number to give call back number to give RN report at [facility].
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
## 2022-08-11 NOTE — ED Notes (Signed)
Patient requesting a cover. ED tech Idalia Needle went to get a blanket and patient then started cursing and screaming, "Fuck you bitch, get me a god damn cover. I already fucking told you to get me a fucking cover." Patient was given a blanket.

## 2022-08-11 NOTE — ED Provider Notes (Signed)
1:54 PM Assumed care for off going team.   Blood pressure 124/87, pulse 75, temperature 98.3 F (36.8 C), temperature source Oral, resp. rate 14, SpO2 94 %.  See their HPI for full report but in brief patient was being very verbally loud with profanity patient willing to take oral medication patient was given 1 mg of oral Ativan.  Transportation came to pick up patient he seemed much more calm and was no longer having verbal shouting out therefore patient was transferred       Concha Se, MD 08/11/22 [phone]

## 2022-08-11 NOTE — ED Notes (Signed)
Patient allowed writer give medication with no issues. Security standing by for Production assistant, radio and patient.

## 2022-08-11 NOTE — BH Assessment (Signed)
Per [facility] ([place]), patient to be referred out of system.  Referral information for Psychiatric Hospitalization faxed to;   [facility] [phone]) No beds available  [facility] [phone]),   [facility] [phone]),  [place] [phone]--- [phone]--- [phone]--- [phone])  [address] [phone]),   [facility] [phone] -or- [phone]),   [facility] [phone]),  [place] [phone])  [facility] [phone])

## 2022-08-11 NOTE — Consult Note (Signed)
[facility] Face-to-Face Psychiatry Consult   Reason for Consult: Aggressive Behavior   Referring Physician:  Dr.Wong Patient Identification: Antonio Mccoy MRN:  [id number] Principal Diagnosis: <principal problem not specified> Diagnosis:  Active Problems:   Undifferentiated schizophrenia (HCC)   Alcohol withdrawal (HCC)   Tobacco use disorder   Schizoaffective disorder (HCC)   Alcohol abuse with intoxication (HCC)   Substance induced mood disorder (HCC)   Alcohol abuse   Insomnia due to other mental disorder   Total Time spent with patient: 1 hour  Subjective:    Antonio Mccoy is a 47 y.o. male patient presented to [facility] ED under involuntary commitment by law enforcement, as noted by triage nurses reporting erratic behavior, including attempting to fight people, talking to oneself and trees, and threatening actions towards officers. Despite denial of jumping in and out of traffic and claiming medication adherence, the patient exhibited irritability and aggression upon arrival, though currently cooperative. Following evaluation and consultation with Dr. Katrinka Blazing, the patient's eligibility for psychiatric inpatient admission was discussed. Alert, oriented, and calm with mood-congruent affect, the patient showed no response to internal or external stimuli and denied auditory or visual hallucinations, as well as suicidal, homicidal, or self-harm ideations. However, the presence of psychotic and paranoid behaviors suggests ongoing monitoring and intervention needs amidst the acute presentation.  HPI: Per Dr.Wong, Antonio Mccoy is a 47 y.o. male   Past medical history of schizophrenia, alcohol use, tobacco use, presents emergency department under police IVC for erratic behavior, violence, weaving in and out of traffic.  Reportedly talking to trees.  Family reports not taking his medications. He arrives with no acute medical complaints but states that he has not been on his psychiatric medications  but denies suicidality or homicidality.   Independent Historian contributed to assessment above: Emergency planning/management officer gives me the report as above   External Medical Documents Reviewed: IVC paperwork by police stating the above reports  Past Psychiatric History:  Schizo affective schizophrenia (HCC)  Risk to Self:   Risk to Others:   Prior Inpatient Therapy:   Prior Outpatient Therapy:    Past Medical History:  Past Medical History:  Diagnosis Date   Schizo affective schizophrenia [facility])     Past Surgical History:  Procedure Laterality Date   ORIF ANKLE FRACTURE Left 02/02/2015   Procedure: OPEN REDUCTION INTERNAL FIXATION (ORIF) ANKLE FRACTURE;  Surgeon: Christena Flake, MD;  Location: [facility] ORS;  Service: Orthopedics;  Laterality: Left;   SMALL INTESTINE SURGERY     Family History: History reviewed. No pertinent family history. Family Psychiatric  History:  Social History:  Social History   Substance and Sexual Activity  Alcohol Use Yes     Social History   Substance and Sexual Activity  Drug Use Yes   Types: Marijuana    Social History   Socioeconomic History   Marital status: Single    Spouse name: Not on file   Number of children: Not on file   Years of education: Not on file   Highest education level: Not on file  Occupational History   Not on file  Tobacco Use   Smoking status: Every Day    Packs/day: 1.00    Years: 10.00    Additional pack years: 0.00    Total pack years: 10.00    Types: Cigarettes   Smokeless tobacco: Never  Vaping Use   Vaping Use: Never used  Substance and Sexual Activity   Alcohol use: Yes   Drug use: Yes  Types: Marijuana   Sexual activity: Yes    Birth control/protection: None  Other Topics Concern   Not on file  Social History Narrative   Not on file   Social Determinants of Health   Financial Resource Strain: Not on file  Food Insecurity: Not on file  Transportation Needs: Not on file  Physical Activity: Not on file   Stress: Not on file  Social Connections: Not on file   Additional Social History:    Allergies:  No Known Allergies  Labs:  Results for orders placed or performed during the hospital encounter of 08/10/22 (from the past 48 hour(s))  Comprehensive metabolic panel     Status: Abnormal   Collection Time: 08/10/22  6:14 PM  Result Value Ref Range   Sodium 134 (L) 135 - 145 mmol/L   Potassium 3.5 3.5 - 5.1 mmol/L   Chloride 99 98 - 111 mmol/L   CO2 23 22 - 32 mmol/L   Glucose, Bld 114 (H) 70 - 99 mg/dL    Comment: Glucose reference range applies only to samples taken after fasting for at least 8 hours.   BUN 9 6 - 20 mg/dL   Creatinine, Ser 1.61 0.61 - 1.24 mg/dL   Calcium 8.6 (L) 8.9 - 10.3 mg/dL   Total Protein 7.2 6.5 - 8.1 g/dL   Albumin 4.1 3.5 - 5.0 g/dL   AST 096 (H) 15 - 41 U/L   ALT 60 (H) 0 - 44 U/L   Alkaline Phosphatase 89 38 - 126 U/L   Total Bilirubin 1.2 0.3 - 1.2 mg/dL   GFR, Estimated >04 >54 mL/min    Comment: (NOTE) Calculated using the CKD-EPI Creatinine Equation (2021)    Anion gap 12 5 - 15    Comment: Performed at [facility], [address] [place], [place] [address]  Ethanol     Status: None   Collection Time: 08/10/22  6:14 PM  Result Value Ref Range   Alcohol, Ethyl (B) <10 <10 mg/dL    Comment: (NOTE) Lowest detectable limit for serum alcohol is 10 mg/dL.  For medical purposes only. Performed at [facility], [address] [place], [place] [address]   Salicylate level     Status: Abnormal   Collection Time: 08/10/22  6:14 PM  Result Value Ref Range   Salicylate Lvl <7.0 (L) 7.0 - 30.0 mg/dL    Comment: Performed at [facility], [address] [place], [place] [address]  Acetaminophen level     Status: Abnormal   Collection Time: 08/10/22  6:14 PM  Result Value Ref Range   Acetaminophen (Tylenol), Serum <10 (L) 10 - 30 ug/mL    Comment: (NOTE) Therapeutic concentrations vary significantly. A range of  10-30 ug/mL  may be an effective concentration for many patients. However, some  are best treated at concentrations outside of this range. Acetaminophen concentrations >150 ug/mL at 4 hours after ingestion  and >50 ug/mL at 12 hours after ingestion are often associated with  toxic reactions.  Performed at [facility], [address] [place], [place] [address]   cbc     Status: None   Collection Time: 08/10/22  6:14 PM  Result Value Ref Range   WBC 6.3 4.0 - 10.5 K/uL   RBC 4.93 4.22 - 5.81 MIL/uL   Hemoglobin 15.9 13.0 - 17.0 g/dL   HCT 57.8 46.9 - 62.9 %   MCV 93.1 80.0 - 100.0 fL   MCH 32.3 26.0 -  34.0 pg   MCHC 34.6 30.0 - 36.0 g/dL   RDW 78.4 69.6 - 29.5 %   Platelets 213 150 - 400 K/uL   nRBC 0.0 0.0 - 0.2 %    Comment: Performed at [facility], [address] [place], [place] [address]  Valproic acid level     Status: Abnormal   Collection Time: 08/10/22  6:14 PM  Result Value Ref Range   Valproic Acid Lvl <10 (L) 50.0 - 100.0 ug/mL    Comment: RESULT CONFIRMED BY MANUAL DILUTION skl Performed at [facility], [address] [place], [place] [address]   Urine Drug Screen, Qualitative     Status: Abnormal   Collection Time: 08/10/22 10:11 PM  Result Value Ref Range   Tricyclic, Ur Screen NONE DETECTED NONE DETECTED   Amphetamines, Ur Screen NONE DETECTED NONE DETECTED   MDMA (Ecstasy)Ur Screen NONE DETECTED NONE DETECTED   Cocaine Metabolite,Ur [place] POSITIVE (A) NONE DETECTED   Opiate, Ur Screen NONE DETECTED NONE DETECTED   Phencyclidine (PCP) Ur S NONE DETECTED NONE DETECTED   Cannabinoid 50 Ng, Ur [place] POSITIVE (A) NONE DETECTED   Barbiturates, Ur Screen NONE DETECTED NONE DETECTED   Benzodiazepine, Ur Scrn NONE DETECTED NONE DETECTED   Methadone Scn, Ur NONE DETECTED NONE DETECTED    Comment: (NOTE) Tricyclics + metabolites, urine    Cutoff 1000 ng/mL Amphetamines + metabolites, urine  Cutoff 1000 ng/mL MDMA (Ecstasy), urine               Cutoff 500 ng/mL Cocaine Metabolite, urine          Cutoff 300 ng/mL Opiate + metabolites, urine        Cutoff 300 ng/mL Phencyclidine (PCP), urine         Cutoff 25 ng/mL Cannabinoid, urine                 Cutoff 50 ng/mL Barbiturates + metabolites, urine  Cutoff 200 ng/mL Benzodiazepine, urine              Cutoff 200 ng/mL Methadone, urine                   Cutoff 300 ng/mL  The urine drug screen provides only a preliminary, unconfirmed analytical test result and should not be used for non-medical purposes. Clinical consideration and professional judgment should be applied to any positive drug screen result due to possible interfering substances. A more specific alternate chemical method must be used in order to obtain a confirmed analytical result. Gas chromatography / mass spectrometry (GC/MS) is the preferred confirm atory method. Performed at [facility], [address] [place], [place] [address]     No current facility-administered medications for this encounter.   Current Outpatient Medications  Medication Sig Dispense Refill   benztropine (COGENTIN) 0.5 MG tablet Take 1 tablet (0.5 mg total) by mouth 2 (two) times daily. (Patient not taking: Reported on 08/10/2022) 60 tablet 1   divalproex (DEPAKOTE) 500 MG DR tablet Take 1 tablet (500 mg total) by mouth 2 (two) times daily. 60 tablet 1   haloperidol (HALDOL) 5 MG tablet Take 1 tablet (5 mg total) by mouth at bedtime. (Patient not taking: Reported on 08/10/2022) 30 tablet 1   methocarbamol (ROBAXIN) 500 MG tablet Take 1 tablet (500 mg total) by mouth every 8 (eight) hours as needed for muscle spasms. (Patient not taking: Reported on 08/10/2022) 30 tablet 0   naproxen (NAPROSYN) 500 MG tablet Take 1  tablet (500 mg total) by mouth 2 (two) times daily. (Patient not taking: Reported on 08/10/2022) 30 tablet 0   traZODone (DESYREL) 50 MG tablet Take 1 tablet (50 mg total) by mouth at bedtime. (Patient not taking:  Reported on 08/10/2022) 30 tablet 1    Musculoskeletal: Strength & Muscle Tone: within normal limits Gait & Station: normal Patient leans: N/A Psychiatric Specialty Exam:  Presentation  General Appearance:  Bizarre  Eye Contact: Minimal  Speech: Garbled; Slurred  Speech Volume: Normal  Handedness: Right   Mood and Affect  Mood: Euthymic  Affect: Inappropriate   Thought Process  Thought Processes: Goal Directed  Descriptions of Associations:Loose  Orientation:Partial  Thought Content:WDL  History of Schizophrenia/Schizoaffective disorder:No data recorded Duration of Psychotic Symptoms:No data recorded Hallucinations:Hallucinations: None  Ideas of Reference:None  Suicidal Thoughts:Suicidal Thoughts: No  Homicidal Thoughts:Homicidal Thoughts: No   Sensorium  Memory: Immediate Poor; Recent Poor; Remote Poor  Judgment: Fair  Insight: Fair   Art therapist  Concentration: Poor  Attention Span: Poor  Recall: Poor  Fund of Knowledge: Poor  Language: Poor   Psychomotor Activity  Psychomotor Activity: Psychomotor Activity: Normal   Assets  Assets: Manufacturing systems engineer; Desire for Improvement; Financial Resources/Insurance; Resilience   Sleep  Sleep: Sleep: Good   Physical Exam: Physical Exam Vitals and nursing note reviewed.  Constitutional:      Appearance: He is normal weight.  HENT:     Head: Normocephalic and atraumatic.     Right Ear: External ear normal.     Left Ear: External ear normal.     Nose: Nose normal.  Cardiovascular:     Rate and Rhythm: Normal rate.     Pulses: Normal pulses.  Pulmonary:     Effort: Pulmonary effort is normal.  Musculoskeletal:        General: Normal range of motion.     Cervical back: Normal range of motion and neck supple.  Neurological:     General: No focal deficit present.     Mental Status: He is alert and oriented to person, place, and time.    Review of Systems   Psychiatric/Behavioral:  The patient is nervous/anxious.   All other systems reviewed and are negative.  Blood pressure (!) 161/91, pulse 82, temperature 98.2 F (36.8 C), temperature source Oral, resp. rate 18, SpO2 96 %. There is no height or weight on file to calculate BMI.  Treatment Plan Summary: Medication management and Plan   - Patient does meet the criteria for psychiatric inpatient admission  Disposition: No evidence of imminent risk to self or others at present.   Patient does not meet criteria for psychiatric inpatient admission. Supportive therapy provided about ongoing stressors. Discussed crisis plan, support from social network, calling 911, coming to the Emergency Department, and calling Suicide Hotline.  Gillermo Murdoch, NP 08/11/2022 3:35 AM

## 2022-08-11 NOTE — ED Notes (Signed)
[facility] called waiting on [facility] to arrive to transport to [facility]

## 2022-08-11 NOTE — ED Notes (Signed)
Transport is here now. VS were obtained.

## 2022-08-11 NOTE — ED Notes (Signed)
Patient screaming "suck my dick bitch, all yall can suck my dick"

## 2022-08-11 NOTE — BH Assessment (Signed)
TTS initially called and spoke with patient's emergency contact Lanora Manis [phone]), to update her about the patient going to [facility]. She instructed TTS to contact his sister Skipper Cliche.), who is his power of attorney and update her. Lanora Manis "is soon to be the ex-wife." Clinical research associate contacted the sister Skipper Cliche. [phone]) and updated her. TTS informed patient registration Ephriam Knuckles) and the sister was added to the contacts.

## 2022-08-11 NOTE — ED Notes (Signed)
Patient yelling and  asking for food  but snack and water was giving ,patient  still yelling

## 2022-08-11 NOTE — ED Notes (Signed)
Pt requesting food. Writer informed pt that he cannot have anymore food and only water. Pt also express wound on inner right big toe. Pt given band aid to cover wound and a cup of water. MD notified

## 2022-08-11 NOTE — BH Assessment (Signed)
Patient has been accepted to [facility].  Patient assigned to main campus Accepting physician is Dr. Loni Beckwith.  To give nurse to nurse report, page [phone] and enter a return phone number.  Representative was [employer].   ER Staff is aware of it:  Glenda, ER Durenda Guthrie, Patient's Nurse     Patient's sister/Power of Attorney Cala Bradford [phone]) have been updated as well.  Address: [address] [place], [place] [address]

## 2022-08-11 NOTE — ED Notes (Signed)
Pt allowed this RN with the help of RN [place] to give them medication. Security was at bedside for the safety of both the patient and the nurses.

## 2022-08-25 ENCOUNTER — Encounter (HOSPITAL_COMMUNITY): Payer: Self-pay

## 2022-08-25 ENCOUNTER — Emergency Department (HOSPITAL_COMMUNITY)
Admission: EM | Admit: 2022-08-25 | Discharge: 2022-08-25 | Disposition: A | Discharge status: Home / Self Care | Payer: Self-pay | Attending: Emergency Medicine | Admitting: Emergency Medicine

## 2022-08-25 ENCOUNTER — Emergency Department (HOSPITAL_COMMUNITY): Payer: Self-pay

## 2022-08-25 ENCOUNTER — Other Ambulatory Visit: Payer: Self-pay

## 2022-08-25 ENCOUNTER — Emergency Department (HOSPITAL_COMMUNITY): Payer: No Typology Code available for payment source

## 2022-08-25 DIAGNOSIS — K1329 Other disturbances of oral epithelium, including tongue: Secondary | ICD-10-CM | POA: Insufficient documentation

## 2022-08-25 DIAGNOSIS — S31119A Laceration without foreign body of abdominal wall, unspecified quadrant without penetration into peritoneal cavity, initial encounter: Secondary | ICD-10-CM

## 2022-08-25 DIAGNOSIS — S31110A Laceration without foreign body of abdominal wall, right upper quadrant without penetration into peritoneal cavity, initial encounter: Secondary | ICD-10-CM | POA: Insufficient documentation

## 2022-08-25 LAB — COMPREHENSIVE METABOLIC PANEL
ALT: 76 U/L — ABNORMAL HIGH (ref 0–44)
AST: 44 U/L — ABNORMAL HIGH (ref 15–41)
Albumin: 4.1 g/dL (ref 3.5–5.0)
Alkaline Phosphatase: 95 U/L (ref 38–126)
Anion gap: 14 (ref 5–15)
BUN: 9 mg/dL (ref 6–20)
CO2: 24 mmol/L (ref 22–32)
Calcium: 8.8 mg/dL — ABNORMAL LOW (ref 8.9–10.3)
Chloride: 99 mmol/L (ref 98–111)
Creatinine, Ser: 1.02 mg/dL (ref 0.61–1.24)
GFR, Estimated: >60 mL/min (ref >60)
Glucose, Bld: 84 mg/dL (ref 70–99)
Potassium: 4 mmol/L (ref 3.5–5.1)
Sodium: 137 mmol/L (ref 135–145)
Total Bilirubin: 0.9 mg/dL (ref 0.3–1.2)
Total Protein: 7.1 g/dL (ref 6.5–8.1)

## 2022-08-25 LAB — I-STAT CHEM 8, ED
BUN: 11 mg/dL (ref 6–20)
Calcium, Ion: 0.99 mmol/L — ABNORMAL LOW (ref 1.15–1.40)
Chloride: 98 mmol/L (ref 98–111)
Creatinine, Ser: 1.2 mg/dL (ref 0.61–1.24)
Glucose, Bld: 83 mg/dL (ref 70–99)
HCT: 48 % (ref 39.0–52.0)
Hemoglobin: 16.3 g/dL (ref 13.0–17.0)
Potassium: 4.1 mmol/L (ref 3.5–5.1)
Sodium: 137 mmol/L (ref 135–145)
TCO2: 26 mmol/L (ref 22–32)

## 2022-08-25 LAB — CBC
HCT: 45.1 % (ref 39.0–52.0)
Hemoglobin: 15.5 g/dL (ref 13.0–17.0)
MCH: 31.5 pg (ref 26.0–34.0)
MCHC: 34.4 g/dL (ref 30.0–36.0)
MCV: 91.7 fL (ref 80.0–100.0)
Platelets: 260 10*3/uL (ref 150–400)
RBC: 4.92 MIL/uL (ref 4.22–5.81)
RDW: 13.7 % (ref 11.5–15.5)
WBC: 7.7 10*3/uL (ref 4.0–10.5)
nRBC: 0 % (ref 0.0–0.2)

## 2022-08-25 LAB — PROTIME-INR
INR: 1 (ref 0.8–1.2)
Prothrombin Time: 13.3 seconds (ref 11.4–15.2)

## 2022-08-25 LAB — SAMPLE TO BLOOD BANK

## 2022-08-25 LAB — ETHANOL: Alcohol, Ethyl (B): 93 mg/dL — ABNORMAL HIGH (ref <10)

## 2022-08-25 LAB — LACTIC ACID, PLASMA: Lactic Acid, Venous: 2.9 mmol/L (ref 0.5–1.9)

## 2022-08-25 MED ORDER — CEFAZOLIN SODIUM-DEXTROSE 2-4 GM/100ML-% IV SOLN
2.0000 g | Freq: Once | INTRAVENOUS | Status: AC
  Administered 2022-08-25: 2 g via INTRAVENOUS

## 2022-08-25 MED ORDER — IOHEXOL 350 MG/ML SOLN
75.0000 mL | Freq: Once | INTRAVENOUS | Status: AC | PRN
  Administered 2022-08-25: 75 mL via INTRAVENOUS

## 2022-08-25 MED ORDER — LIDOCAINE-EPINEPHRINE 1 %-1:100000 IJ SOLN
10.0000 mL | Freq: Once | INTRAMUSCULAR | Status: AC
  Administered 2022-08-25: 10 mL
  Filled 2022-08-25: qty 1
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
## 2022-08-25 NOTE — ED Notes (Signed)
Wound dressed. No acute distress noted.

## 2022-08-25 NOTE — ED Notes (Signed)
Dr. Donnald Garre stapling stomach laceration @ bedside.
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
## 2022-08-25 NOTE — ED Notes (Signed)
Patient verbalizes understanding of discharge instructions. Opportunity for questioning and answers were provided. Armband removed by staff, pt discharged from ED. Pt wheeled out to the lobby, went home with family
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
## 2022-08-25 NOTE — ED Notes (Signed)
Pt left at 2030

## 2022-08-25 NOTE — Progress Notes (Signed)
   08/25/22 1816  Spiritual Encounters  Type of Visit Initial  Care provided to: Patient  Conversation partners present during encounter Nurse  Reason for visit Trauma  OnCall Visit No   Patient was leaving ED as I arrived.  He was awake and seemed stable as nurses took him to a room.  Chaplain Aiko Belko Lile-King

## 2022-09-05 ENCOUNTER — Ambulatory Visit (HOSPITAL_COMMUNITY)
Admission: EM | Admit: 2022-09-05 | Discharge: 2022-09-05 | Disposition: A | Discharge status: Home / Self Care | Payer: Self-pay | Attending: Internal Medicine | Admitting: Internal Medicine

## 2022-09-05 ENCOUNTER — Encounter (HOSPITAL_COMMUNITY): Payer: Self-pay

## 2022-09-05 DIAGNOSIS — Z4802 Encounter for removal of sutures: Secondary | ICD-10-CM

## 2022-09-05 NOTE — ED Triage Notes (Signed)
Pt presents for staple removal. Pt denies discomfort, pain, drainage.
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
## 2022-09-27 ENCOUNTER — Other Ambulatory Visit: Payer: Self-pay

## 2022-09-27 ENCOUNTER — Observation Stay (HOSPITAL_COMMUNITY)
Admission: EM | Admit: 2022-09-27 | Discharge: 2022-09-27 | Disposition: A | Discharge status: Home / Self Care | Payer: Self-pay | Attending: General Surgery | Admitting: General Surgery

## 2022-09-27 DIAGNOSIS — S51811A Laceration without foreign body of right forearm, initial encounter: Principal | ICD-10-CM | POA: Insufficient documentation

## 2022-09-27 DIAGNOSIS — Z79899 Other long term (current) drug therapy: Secondary | ICD-10-CM | POA: Insufficient documentation

## 2022-09-27 DIAGNOSIS — E876 Hypokalemia: Secondary | ICD-10-CM | POA: Insufficient documentation

## 2022-09-27 DIAGNOSIS — Y92521 Bus station as the place of occurrence of the external cause: Secondary | ICD-10-CM | POA: Insufficient documentation

## 2022-09-27 DIAGNOSIS — S41111A Laceration without foreign body of right upper arm, initial encounter: Secondary | ICD-10-CM

## 2022-09-27 DIAGNOSIS — F17210 Nicotine dependence, cigarettes, uncomplicated: Secondary | ICD-10-CM | POA: Insufficient documentation

## 2022-09-27 DIAGNOSIS — Z23 Encounter for immunization: Secondary | ICD-10-CM | POA: Insufficient documentation

## 2022-09-27 DIAGNOSIS — T148XXA Other injury of unspecified body region, initial encounter: Secondary | ICD-10-CM | POA: Diagnosis present

## 2022-09-27 LAB — I-STAT CHEM 8, ED
BUN: 7 mg/dL (ref 6–20)
Calcium, Ion: 1.05 mmol/L — ABNORMAL LOW (ref 1.15–1.40)
Chloride: 103 mmol/L (ref 98–111)
Creatinine, Ser: 1 mg/dL (ref 0.61–1.24)
Glucose, Bld: 125 mg/dL — ABNORMAL HIGH (ref 70–99)
HCT: 39 % (ref 39.0–52.0)
Hemoglobin: 13.3 g/dL (ref 13.0–17.0)
Potassium: 3.2 mmol/L — ABNORMAL LOW (ref 3.5–5.1)
Sodium: 138 mmol/L (ref 135–145)
TCO2: 23 mmol/L (ref 22–32)

## 2022-09-27 LAB — CBC
HCT: 43.8 % (ref 39.0–52.0)
HCT: 38.7 % — ABNORMAL LOW (ref 39.0–52.0)
Hemoglobin: 14.1 g/dL (ref 13.0–17.0)
Hemoglobin: 13 g/dL (ref 13.0–17.0)
MCH: 31.9 pg (ref 26.0–34.0)
MCH: 31.9 pg (ref 26.0–34.0)
MCHC: 32.2 g/dL (ref 30.0–36.0)
MCHC: 33.6 g/dL (ref 30.0–36.0)
MCV: 99.1 fL (ref 80.0–100.0)
MCV: 95.1 fL (ref 80.0–100.0)
Platelets: 245 10*3/uL (ref 150–400)
Platelets: 234 10*3/uL (ref 150–400)
RBC: 4.42 MIL/uL (ref 4.22–5.81)
RBC: 4.07 MIL/uL — ABNORMAL LOW (ref 4.22–5.81)
RDW: 14.4 % (ref 11.5–15.5)
RDW: 14.3 % (ref 11.5–15.5)
WBC: 4.2 10*3/uL (ref 4.0–10.5)
WBC: 5.6 10*3/uL (ref 4.0–10.5)
nRBC: 0 % (ref 0.0–0.2)
nRBC: 0 % (ref 0.0–0.2)

## 2022-09-27 LAB — BASIC METABOLIC PANEL
Anion gap: 9 (ref 5–15)
BUN: 6 mg/dL (ref 6–20)
CO2: 24 mmol/L (ref 22–32)
Calcium: 8.3 mg/dL — ABNORMAL LOW (ref 8.9–10.3)
Chloride: 104 mmol/L (ref 98–111)
Creatinine, Ser: 0.94 mg/dL (ref 0.61–1.24)
GFR, Estimated: >60 mL/min (ref >60)
Glucose, Bld: 103 mg/dL — ABNORMAL HIGH (ref 70–99)
Potassium: 3.4 mmol/L — ABNORMAL LOW (ref 3.5–5.1)
Sodium: 137 mmol/L (ref 135–145)

## 2022-09-27 LAB — COMPREHENSIVE METABOLIC PANEL
ALT: 27 U/L (ref 0–44)
AST: 28 U/L (ref 15–41)
Albumin: 3.7 g/dL (ref 3.5–5.0)
Alkaline Phosphatase: 72 U/L (ref 38–126)
Anion gap: 20 — ABNORMAL HIGH (ref 5–15)
BUN: 7 mg/dL (ref 6–20)
CO2: 20 mmol/L — ABNORMAL LOW (ref 22–32)
Calcium: 8.8 mg/dL — ABNORMAL LOW (ref 8.9–10.3)
Chloride: 100 mmol/L (ref 98–111)
Creatinine, Ser: 1.11 mg/dL (ref 0.61–1.24)
GFR, Estimated: >60 mL/min (ref >60)
Glucose, Bld: 85 mg/dL (ref 70–99)
Potassium: 3.5 mmol/L (ref 3.5–5.1)
Sodium: 140 mmol/L (ref 135–145)
Total Bilirubin: 0.6 mg/dL (ref 0.3–1.2)
Total Protein: 6.4 g/dL — ABNORMAL LOW (ref 6.5–8.1)

## 2022-09-27 LAB — LACTIC ACID, PLASMA: Lactic Acid, Venous: 5.1 mmol/L (ref 0.5–1.9)

## 2022-09-27 LAB — HIV ANTIBODY (ROUTINE TESTING W REFLEX): HIV Screen 4th Generation wRfx: NONREACTIVE

## 2022-09-27 LAB — PROTIME-INR
INR: 1 (ref 0.8–1.2)
Prothrombin Time: 13.6 seconds (ref 11.4–15.2)

## 2022-09-27 LAB — ETHANOL: Alcohol, Ethyl (B): 157 mg/dL — ABNORMAL HIGH (ref <10)

## 2022-09-27 MED ORDER — ENOXAPARIN SODIUM 30 MG/0.3ML IJ SOSY: 30.0000 mg | PREFILLED_SYRINGE | Freq: Two times a day (BID) | INTRAMUSCULAR | Status: DC

## 2022-09-27 MED ORDER — CEFAZOLIN SODIUM-DEXTROSE 2-4 GM/100ML-% IV SOLN
2.0000 g | Freq: Once | INTRAVENOUS | Status: AC
  Administered 2022-09-27: 2 g via INTRAVENOUS
  Filled 2022-09-27: qty 100

## 2022-09-27 MED ORDER — THIAMINE HCL 100 MG/ML IJ SOLN: 100.0000 mg | Freq: Every day | INTRAMUSCULAR | Status: DC

## 2022-09-27 MED ORDER — METHOCARBAMOL 500 MG PO TABS
500.0000 mg | ORAL_TABLET | Freq: Three times a day (TID) | ORAL | Status: DC
  Administered 2022-09-27: 500 mg via ORAL
  Filled 2022-09-27 (×2): qty 1

## 2022-09-27 MED ORDER — POLYETHYLENE GLYCOL 3350 17 G PO PACK: 17.0000 g | PACK | Freq: Every day | ORAL | Status: DC | PRN

## 2022-09-27 MED ORDER — LIDOCAINE-EPINEPHRINE (PF) 2 %-1:200000 IJ SOLN
INTRAMUSCULAR | Status: AC
  Filled 2022-09-27: qty 40

## 2022-09-27 MED ORDER — ADULT MULTIVITAMIN W/MINERALS CH
1.0000 | ORAL_TABLET | Freq: Every day | ORAL | Status: DC
  Administered 2022-09-27: 1 via ORAL
  Filled 2022-09-27: qty 1

## 2022-09-27 MED ORDER — HALOPERIDOL LACTATE 5 MG/ML IJ SOLN: 5.0000 mg | Freq: Once | INTRAMUSCULAR | Status: DC

## 2022-09-27 MED ORDER — MORPHINE SULFATE (PF) 2 MG/ML IV SOLN
1.0000 mg | INTRAVENOUS | Status: DC | PRN
  Administered 2022-09-27: 2 mg via INTRAVENOUS
  Filled 2022-09-27: qty 1

## 2022-09-27 MED ORDER — ACETAMINOPHEN 500 MG PO TABS: 1000.0000 mg | ORAL_TABLET | Freq: Three times a day (TID) | ORAL | Status: AC | PRN

## 2022-09-27 MED ORDER — FOLIC ACID 1 MG PO TABS
1.0000 mg | ORAL_TABLET | Freq: Every day | ORAL | Status: DC
  Administered 2022-09-27: 1 mg via ORAL
  Filled 2022-09-27: qty 1

## 2022-09-27 MED ORDER — THIAMINE MONONITRATE 100 MG PO TABS
100.0000 mg | ORAL_TABLET | Freq: Every day | ORAL | Status: DC
  Administered 2022-09-27: 100 mg via ORAL
  Filled 2022-09-27: qty 1

## 2022-09-27 MED ORDER — LORAZEPAM 1 MG PO TABS: 1.0000 mg | ORAL_TABLET | ORAL | Status: DC | PRN

## 2022-09-27 MED ORDER — ACETAMINOPHEN 500 MG PO TABS
1000.0000 mg | ORAL_TABLET | Freq: Four times a day (QID) | ORAL | Status: DC
  Administered 2022-09-27 (×2): 1000 mg via ORAL
  Filled 2022-09-27 (×2): qty 2

## 2022-09-27 MED ORDER — LIDOCAINE HCL (PF) 1 % IJ SOLN
30.0000 mL | Freq: Once | INTRAMUSCULAR | Status: AC
  Filled 2022-09-27: qty 30

## 2022-09-27 MED ORDER — TETANUS-DIPHTH-ACELL PERTUSSIS 5-2.5-18.5 LF-MCG/0.5 IM SUSY
0.5000 mL | PREFILLED_SYRINGE | Freq: Once | INTRAMUSCULAR | Status: AC
  Administered 2022-09-27: 0.5 mL via INTRAMUSCULAR
  Filled 2022-09-27: qty 0.5

## 2022-09-27 MED ORDER — HALOPERIDOL 1 MG PO TABS
1.0000 mg | ORAL_TABLET | Freq: Two times a day (BID) | ORAL | Status: DC
  Filled 2022-09-27 (×3): qty 1

## 2022-09-27 MED ORDER — LIDOCAINE HCL (PF) 1 % IJ SOLN: 30.0000 mL | Freq: Once | INTRAMUSCULAR | Status: DC

## 2022-09-27 MED ORDER — TRAZODONE HCL 100 MG PO TABS: 100.0000 mg | ORAL_TABLET | Freq: Every day | ORAL | Status: DC

## 2022-09-27 MED ORDER — LIDOCAINE HCL (PF) 1 % IJ SOLN
INTRAMUSCULAR | Status: AC
  Administered 2022-09-27: 30 mL via INTRADERMAL
  Filled 2022-09-27: qty 30

## 2022-09-27 MED ORDER — OXYCODONE HCL 5 MG PO TABS: 5.0000 mg | ORAL_TABLET | ORAL | Status: DC | PRN

## 2022-09-27 MED ORDER — OXYCODONE HCL 5 MG PO TABS: 5.0000 mg | ORAL_TABLET | Freq: Four times a day (QID) | ORAL | 0 refills | Status: AC | PRN

## 2022-09-27 MED ORDER — OXYCODONE HCL 5 MG PO TABS
10.0000 mg | ORAL_TABLET | ORAL | Status: DC | PRN
  Administered 2022-09-27: 10 mg via ORAL
  Filled 2022-09-27: qty 2

## 2022-09-27 MED ORDER — ONDANSETRON HCL 4 MG/2ML IJ SOLN: 4.0000 mg | Freq: Four times a day (QID) | INTRAMUSCULAR | Status: DC | PRN

## 2022-09-27 MED ORDER — LIDOCAINE HCL (PF) 2 % IJ SOLN
30.0000 mL | Freq: Once | INTRAMUSCULAR | Status: DC
  Filled 2022-09-27: qty 30

## 2022-09-27 MED ORDER — METHOCARBAMOL 1000 MG/10ML IJ SOLN
500.0000 mg | Freq: Three times a day (TID) | INTRAVENOUS | Status: DC
  Filled 2022-09-27 (×2): qty 5

## 2022-09-27 MED ORDER — METOPROLOL TARTRATE 5 MG/5ML IV SOLN: 5.0000 mg | Freq: Four times a day (QID) | INTRAVENOUS | Status: DC | PRN

## 2022-09-27 MED ORDER — ONDANSETRON 4 MG PO TBDP: 4.0000 mg | ORAL_TABLET | Freq: Four times a day (QID) | ORAL | Status: DC | PRN

## 2022-09-27 MED ORDER — DOCUSATE SODIUM 100 MG PO CAPS
100.0000 mg | ORAL_CAPSULE | Freq: Two times a day (BID) | ORAL | Status: DC
  Administered 2022-09-27: 100 mg via ORAL
  Filled 2022-09-27: qty 1

## 2022-09-27 MED ORDER — BENZTROPINE MESYLATE 0.5 MG PO TABS
0.5000 mg | ORAL_TABLET | Freq: Two times a day (BID) | ORAL | Status: DC
  Administered 2022-09-27: 0.5 mg via ORAL
  Filled 2022-09-27: qty 1

## 2022-09-27 MED ORDER — HYDRALAZINE HCL 20 MG/ML IJ SOLN: 10.0000 mg | INTRAMUSCULAR | Status: DC | PRN

## 2022-09-27 NOTE — ED Notes (Signed)
EDP suturing patients laceration .
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
## 2022-09-27 NOTE — ED Notes (Signed)
Ace bandage loosened at request of pt, bleeding controlled at this time CMS less than 3 seconds, palpable pulses.
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
## 2022-09-27 NOTE — ED Notes (Signed)
Complains of 10/10 left chest pain. EKG obtained, Dr. Andrey Campanile trauma MD notified.
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
## 2022-09-27 NOTE — ED Notes (Signed)
Tourniquet reapplied by EDP.

## 2022-09-27 NOTE — TOC CAGE-AID Note (Signed)
Transition of Care [facility]) - CAGE-AID Screening   Patient Details  Name: Antonio Mccoy MRN: [id number] Date of Birth: 1975-10-10  Transition of Care [facility]) CM/SW Contact:    Katha Hamming, RN Phone Number: 09/27/2022, 2:20 AM   CAGE-AID Screening:    Have You Ever Felt You Ought to Cut Down on Your Drinking or Drug Use?: No Have People Annoyed You By Critizing Your Drinking Or Drug Use?: No Have You Felt Bad Or Guilty About Your Drinking Or Drug Use?: No Have You Ever Had a Drink or Used Drugs First Thing In The Morning to Steady Your Nerves or to Get Rid of a Hangover?: No CAGE-AID Score: 0  Substance Abuse Education Offered: No
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
## 2022-09-27 NOTE — Progress Notes (Signed)
Chart reviewed, patient is reportedly nvi and bleeding controlled. At this location, I and D and primary skin closure will be performed at bedside.   Philipp Ovens, MD
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
## 2022-09-27 NOTE — ED Notes (Signed)
TDAP not given , patient stated he had it .

## 2022-09-27 NOTE — ED Notes (Signed)
Dr. Yehuda Budd arrived and explained plan of care to patient at bedside .
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
## 2022-11-22 ENCOUNTER — Other Ambulatory Visit: Payer: Self-pay

## 2023-05-17 ENCOUNTER — Ambulatory Visit (HOSPITAL_COMMUNITY)
Admission: RE | Admit: 2023-05-17 | Discharge: 2023-05-17 | Disposition: A | Discharge status: Home / Self Care | Payer: Medicaid Other | Source: Ambulatory Visit | Attending: Physician Assistant | Admitting: Physician Assistant

## 2023-05-17 ENCOUNTER — Encounter (HOSPITAL_COMMUNITY): Payer: Self-pay

## 2023-05-17 ENCOUNTER — Other Ambulatory Visit: Payer: Self-pay

## 2023-05-17 VITALS — BP 128/67 | HR 75 | Temp 97.6°F | Resp 14

## 2023-05-17 DIAGNOSIS — R42 Dizziness and giddiness: Secondary | ICD-10-CM

## 2023-05-17 DIAGNOSIS — R079 Chest pain, unspecified: Secondary | ICD-10-CM | POA: Diagnosis present

## 2023-05-17 DIAGNOSIS — J069 Acute upper respiratory infection, unspecified: Secondary | ICD-10-CM

## 2023-05-17 DIAGNOSIS — Z113 Encounter for screening for infections with a predominantly sexual mode of transmission: Secondary | ICD-10-CM | POA: Diagnosis present

## 2023-05-17 LAB — HIV ANTIBODY (ROUTINE TESTING W REFLEX): HIV Screen 4th Generation wRfx: NONREACTIVE

## 2023-05-17 NOTE — Discharge Instructions (Addendum)
At this time I suspect that your dizziness and chest tightness is likely secondary to a viral upper respiratory infection.  This is also known as a " cold" You can treat this with over-the-counter medications such as DayQuil/NyQuil, TheraFlu or Alka-Seltzer per your preference.  If you start to develop shortness of breath, persistent chest pain, fever, chills, signs of dehydration please go to the emergency room for further evaluation and management.  We will keep you updated on the results of your STD testing.  Once it is available, if medications are indicated we will send those in to the pharmacy that we have on file.
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
## 2023-05-17 NOTE — ED Triage Notes (Signed)
Patient reports that he has been having intermittent mid chest pain, dizziness, and slight SOB x 3 days. Patient states he has been having heartburn at times.   Patient states he has been taking tylenol and the last dose was at 0800 today.

## 2023-05-18 LAB — CYTOLOGY, (ORAL, ANAL, URETHRAL) ANCILLARY ONLY
Chlamydia: NEGATIVE
Comment: NEGATIVE
Comment: NEGATIVE
Comment: NORMAL
Neisseria Gonorrhea: NEGATIVE
Trichomonas: NEGATIVE

## 2023-05-18 LAB — RPR: RPR Ser Ql: NONREACTIVE

## 2023-05-29 ENCOUNTER — Emergency Department (HOSPITAL_COMMUNITY): Payer: Medicaid Other

## 2023-05-29 ENCOUNTER — Emergency Department (HOSPITAL_COMMUNITY)
Admission: EM | Admit: 2023-05-29 | Discharge: 2023-05-29 | Disposition: A | Discharge status: Home / Self Care | Payer: Medicaid Other | Attending: Emergency Medicine | Admitting: Emergency Medicine

## 2023-05-29 ENCOUNTER — Encounter (HOSPITAL_COMMUNITY): Payer: Self-pay

## 2023-05-29 ENCOUNTER — Other Ambulatory Visit: Payer: Self-pay

## 2023-05-29 DIAGNOSIS — Z79899 Other long term (current) drug therapy: Secondary | ICD-10-CM | POA: Insufficient documentation

## 2023-05-29 DIAGNOSIS — M62838 Other muscle spasm: Secondary | ICD-10-CM | POA: Diagnosis not present

## 2023-05-29 DIAGNOSIS — M542 Cervicalgia: Secondary | ICD-10-CM | POA: Diagnosis present

## 2023-05-29 LAB — CBC WITH DIFFERENTIAL/PLATELET
Abs Immature Granulocytes: 0.01 10*3/uL (ref 0.00–0.07)
Basophils Absolute: 0 10*3/uL (ref 0.0–0.1)
Basophils Relative: 1 %
Eosinophils Absolute: 0.1 10*3/uL (ref 0.0–0.5)
Eosinophils Relative: 2 %
HCT: 48 % (ref 39.0–52.0)
Hemoglobin: 16.3 g/dL (ref 13.0–17.0)
Immature Granulocytes: 0 %
Lymphocytes Relative: 50 %
Lymphs Abs: 2.3 10*3/uL (ref 0.7–4.0)
MCH: 31.3 pg (ref 26.0–34.0)
MCHC: 34 g/dL (ref 30.0–36.0)
MCV: 92.1 fL (ref 80.0–100.0)
Monocytes Absolute: 0.4 10*3/uL (ref 0.1–1.0)
Monocytes Relative: 8 %
Neutro Abs: 1.8 10*3/uL (ref 1.7–7.7)
Neutrophils Relative %: 39 %
Platelets: 226 10*3/uL (ref 150–400)
RBC: 5.21 MIL/uL (ref 4.22–5.81)
RDW: 13.1 % (ref 11.5–15.5)
WBC: 4.5 10*3/uL (ref 4.0–10.5)
nRBC: 0 % (ref 0.0–0.2)

## 2023-05-29 LAB — BASIC METABOLIC PANEL
Anion gap: 9 (ref 5–15)
BUN: 17 mg/dL (ref 6–20)
CO2: 23 mmol/L (ref 22–32)
Calcium: 9.3 mg/dL (ref 8.9–10.3)
Chloride: 107 mmol/L (ref 98–111)
Creatinine, Ser: 1.14 mg/dL (ref 0.61–1.24)
GFR, Estimated: >60 mL/min (ref >60)
Glucose, Bld: 102 mg/dL — ABNORMAL HIGH (ref 70–99)
Potassium: 4 mmol/L (ref 3.5–5.1)
Sodium: 139 mmol/L (ref 135–145)

## 2023-05-29 LAB — RESP PANEL BY RT-PCR (RSV, FLU A&B, COVID)  RVPGX2
Influenza A by PCR: NEGATIVE
Influenza B by PCR: NEGATIVE
Resp Syncytial Virus by PCR: NEGATIVE
SARS Coronavirus 2 by RT PCR: NEGATIVE

## 2023-05-29 MED ORDER — KETOROLAC TROMETHAMINE 15 MG/ML IJ SOLN
15.0000 mg | Freq: Once | INTRAMUSCULAR | Status: AC
  Administered 2023-05-29: 15 mg via INTRAMUSCULAR
  Filled 2023-05-29: qty 1

## 2023-05-29 MED ORDER — ETODOLAC 400 MG PO TABS: 400.0000 mg | ORAL_TABLET | Freq: Two times a day (BID) | ORAL | 0 refills | Status: AC

## 2023-05-29 MED ORDER — DIAZEPAM 2 MG PO TABS
2.0000 mg | ORAL_TABLET | Freq: Once | ORAL | Status: AC
  Administered 2023-05-29: 2 mg via ORAL
  Filled 2023-05-29: qty 1

## 2023-05-29 MED ORDER — METHOCARBAMOL 500 MG PO TABS: 500.0000 mg | ORAL_TABLET | Freq: Two times a day (BID) | ORAL | 0 refills | Status: AC
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
## 2023-05-29 NOTE — ED Provider Notes (Signed)
 [facility] AT [facility] Provider Note   CSN: [id number] Arrival date & time: 05/29/23  1348     History  Chief Complaint  Patient presents with   Neck Pain    Antonio Mccoy is a 48 y.o. male.  48 year old male presents today for concern of neck pain.  Ongoing for about 1 week.  He states he is generally not been feeling well for 1 week.  He did lift something heavy 1 week ago that weighed about 100 pounds.  He does not typically do this routinely.  Denies any cough, other URI symptoms.  No fever, vision change, or headaches.  The history is provided by the patient. No language interpreter was used.       Home Medications Prior to Admission medications   Medication Sig Start Date End Date Taking? Authorizing Provider  acetaminophen (TYLENOL) 500 MG tablet Take 2 tablets (1,000 mg total) by mouth every 8 (eight) hours as needed for mild pain or fever. 09/27/22   Juliet Rude, PA-C  benztropine (COGENTIN) 0.5 MG tablet Take 1 tablet (0.5 mg total) by mouth 2 (two) times daily. Patient not taking: Reported on 08/10/2022 05/06/21   Karel Jarvis E, PA  divalproex (DEPAKOTE) 500 MG DR tablet Take 500 mg by mouth 2 (two) times daily. Patient not taking: Reported on 09/27/2022    [provider]  haloperidol (HALDOL) 10 MG tablet Take 10 mg by mouth 2 (two) times daily. 08/22/22   [provider]  oxyCODONE (OXY IR/ROXICODONE) 5 MG immediate release tablet Take 1 tablet (5 mg total) by mouth every 6 (six) hours as needed for moderate pain or severe pain. 09/27/22   Juliet Rude, PA-C  traZODone (DESYREL) 100 MG tablet Take 100 mg by mouth at bedtime. 08/22/22   [provider]      Allergies    Patient has no known allergies.    Review of Systems   Review of Systems  Constitutional:  Negative for chills and fever.  Eyes:  Negative for visual disturbance.  Musculoskeletal:  Positive for neck pain and neck stiffness.   Neurological:  Negative for headaches.  All other systems reviewed and are negative.   Physical Exam Updated Vital Signs BP 126/89 (BP Location: Right Arm)   Pulse 70   Temp 97.9 F (36.6 C) (Oral)   Resp 18   Ht 5\' 10"  (1.778 m)   Wt 86.2 kg   SpO2 100%   BMI 27.26 kg/m  Physical Exam  ED Results / Procedures / Treatments   Labs (all labs ordered are listed, but only abnormal results are displayed) Labs Reviewed  BASIC METABOLIC PANEL - Abnormal; Notable for the following components:      Result Value   Glucose, Bld 102 (*)    All other components within normal limits  RESP PANEL BY RT-PCR (RSV, FLU A&B, COVID)  RVPGX2  CBC WITH DIFFERENTIAL/PLATELET    EKG None  Radiology DG Chest 2 View Result Date: 05/29/2023 CLINICAL DATA:  Cough.  Weakness. EXAM: CHEST - 2 VIEW COMPARISON:  08/25/2022. FINDINGS: Bilateral lung fields are clear. Bilateral costophrenic angles are clear. Normal cardio-mediastinal silhouette. No acute osseous abnormalities. The soft tissues are within normal limits. IMPRESSION: No active cardiopulmonary disease. Electronically Signed   By: Jules Schick M.D.   On: 05/29/2023 16:40    Procedures Procedures    Medications Ordered in ED Medications - No data to display  ED Course/ Medical Decision  Making/ A&P                                 Medical Decision Making Amount and/or Complexity of Data Reviewed Labs: ordered. Radiology: ordered.   Medical Decision Making / ED Course   This patient presents to the ED for concern of neck pain, this involves an extensive number of treatment options, and is a complaint that carries with it a high risk of complications and morbidity.  The differential diagnosis includes muscle spasm, muscle strain, fracture, meningitis  MDM: 48 year old male presents today for concern of neck pain and stiffness.  He states he did lift something that was heavy which was about 100 pounds.  He does not routinely lift  something this heavy.  He denies any injury to the neck, paresthesias in either of the upper extremities.  He denies fever, cough, other URI symptoms.  No headache, vision change. Low suspicion for meningitis given he is afebrile, without headache, without vision change, and his symptoms have been going on for a week.  He is without leukocytosis. Does have tenderness palpation including his trapezius muscles.  Likely musculoskeletal.  Will give Valium, Toradol in the emergency department.  Discussion had regarding concern for meningitis which is likely low but offered LP which she defers.  Strict return precautions given.  Patient voices understanding and is in agreement with plan.   Lab Tests: -I ordered, reviewed, and interpreted labs.   The pertinent results include:   Labs Reviewed  BASIC METABOLIC PANEL - Abnormal; Notable for the following components:      Result Value   Glucose, Bld 102 (*)    All other components within normal limits  RESP PANEL BY RT-PCR (RSV, FLU A&B, COVID)  RVPGX2  CBC WITH DIFFERENTIAL/PLATELET      EKG  EKG Interpretation Date/Time:    Ventricular Rate:    PR Interval:    QRS Duration:    QT Interval:    QTC Calculation:   R Axis:      Text Interpretation:          Medicines ordered and prescription drug management: Meds ordered this encounter  Medications   diazepam (VALIUM) tablet 2 mg   ketorolac (TORADOL) 15 MG/ML injection 15 mg    -I have reviewed the patients home medicines and have made adjustments as needed   Reevaluation: After the interventions noted above, I reevaluated the patient and found that they have :stayed the same  Co morbidities that complicate the patient evaluation  Past Medical History:  Diagnosis Date   Schizo affective schizophrenia (HCC)       Dispostion: Discharged in stable condition.  Return precaution discussed.  Patient voices understanding and is in agreement with plan.   Final Clinical  Impression(s) / ED Diagnoses Final diagnoses:  Muscle spasms of neck    Rx / DC Orders ED Discharge Orders          Ordered    methocarbamol (ROBAXIN) 500 MG tablet  2 times daily        05/29/23 1805    etodolac (LODINE) 400 MG tablet  2 times daily        05/29/23 1805              Marita [place], [place] 05/29/23 1806    Laurence Spates, MD 05/29/23 [phone]

## 2023-05-29 NOTE — ED Provider Triage Note (Signed)
 Emergency Medicine Provider Triage Evaluation Note  Antonio Mccoy , a 49 y.o. male  was evaluated in triage.  Pt complains of not feeling well for 1 week. Neck pain. Lifted something heavy before this. Overall not feeling well..  Review of Systems  Positive: As above Negative: As above  Physical Exam  BP 116/76 (BP Location: Left Arm)   Pulse 79   Temp 98.3 F (36.8 C)   Resp 16   Ht 5\' 10"  (1.778 m)   Wt 86.2 kg   SpO2 98%   BMI 27.26 kg/m  Gen:   Awake, no distress   Resp:  Normal effort MSK:   Moves extremities without difficulty  Other:  Negative meningismus signs  Medical Decision Making  Medically screening exam initiated at 2:12 PM.  Appropriate orders placed.  Coren L Froman was informed that the remainder of the evaluation will be completed by another provider, this initial triage assessment does not replace that evaluation, and the importance of remaining in the ED until their evaluation is complete.     Marita [place], PA-C 05/29/23 1416

## 2023-05-29 NOTE — Discharge Instructions (Addendum)
 Your workup today was reassuring.  You likely have a muscle spasm.  We discussed concern for meningitis.  At this time you appear to be at low risk for this.  However if you develop fever, headache, vision change in your neck stiffness does not improve please return for evaluation.  Otherwise follow-up with your primary care provider.  I have sent in a muscle relaxer called Robaxin.  This will make you drowsy.  Do not drive or do anything else dangerous after taking this.  Have also sent in an anti-inflammatory medication.  You were given some medicines in the emergency department as well.

## 2023-06-16 ENCOUNTER — Telehealth (HOSPITAL_COMMUNITY): Payer: Self-pay

## 2023-06-28 ENCOUNTER — Emergency Department (HOSPITAL_COMMUNITY)
Admission: EM | Admit: 2023-06-28 | Discharge: 2023-06-28 | Disposition: A | Discharge status: Home / Self Care | Attending: Emergency Medicine | Admitting: Emergency Medicine

## 2023-06-28 ENCOUNTER — Other Ambulatory Visit: Payer: Self-pay

## 2023-06-28 ENCOUNTER — Emergency Department (HOSPITAL_COMMUNITY)

## 2023-06-28 DIAGNOSIS — S50312A Abrasion of left elbow, initial encounter: Secondary | ICD-10-CM | POA: Insufficient documentation

## 2023-06-28 DIAGNOSIS — S01112A Laceration without foreign body of left eyelid and periocular area, initial encounter: Secondary | ICD-10-CM | POA: Insufficient documentation

## 2023-06-28 DIAGNOSIS — S60511A Abrasion of right hand, initial encounter: Secondary | ICD-10-CM | POA: Diagnosis not present

## 2023-06-28 DIAGNOSIS — S80811A Abrasion, right lower leg, initial encounter: Secondary | ICD-10-CM | POA: Diagnosis not present

## 2023-06-28 DIAGNOSIS — S0181XA Laceration without foreign body of other part of head, initial encounter: Secondary | ICD-10-CM

## 2023-06-28 DIAGNOSIS — S60512A Abrasion of left hand, initial encounter: Secondary | ICD-10-CM | POA: Insufficient documentation

## 2023-06-28 DIAGNOSIS — M542 Cervicalgia: Secondary | ICD-10-CM | POA: Diagnosis present

## 2023-06-28 DIAGNOSIS — S50311A Abrasion of right elbow, initial encounter: Secondary | ICD-10-CM | POA: Diagnosis not present

## 2023-06-28 DIAGNOSIS — S80812A Abrasion, left lower leg, initial encounter: Secondary | ICD-10-CM | POA: Insufficient documentation

## 2023-06-28 DIAGNOSIS — S139XXA Sprain of joints and ligaments of unspecified parts of neck, initial encounter: Secondary | ICD-10-CM | POA: Diagnosis not present

## 2023-06-28 DIAGNOSIS — S0083XA Contusion of other part of head, initial encounter: Secondary | ICD-10-CM

## 2023-06-28 LAB — CBC WITH DIFFERENTIAL/PLATELET
Abs Immature Granulocytes: 0.02 10*3/uL (ref 0.00–0.07)
Basophils Absolute: 0 10*3/uL (ref 0.0–0.1)
Basophils Relative: 0 %
Eosinophils Absolute: 0 10*3/uL (ref 0.0–0.5)
Eosinophils Relative: 1 %
HCT: 41.4 % (ref 39.0–52.0)
Hemoglobin: 14.3 g/dL (ref 13.0–17.0)
Immature Granulocytes: 0 %
Lymphocytes Relative: 23 %
Lymphs Abs: 1.8 10*3/uL (ref 0.7–4.0)
MCH: 31.9 pg (ref 26.0–34.0)
MCHC: 34.5 g/dL (ref 30.0–36.0)
MCV: 92.4 fL (ref 80.0–100.0)
Monocytes Absolute: 0.5 10*3/uL (ref 0.1–1.0)
Monocytes Relative: 6 %
Neutro Abs: 5.6 10*3/uL (ref 1.7–7.7)
Neutrophils Relative %: 70 %
Platelets: 280 10*3/uL (ref 150–400)
RBC: 4.48 MIL/uL (ref 4.22–5.81)
RDW: 12.9 % (ref 11.5–15.5)
WBC: 7.9 10*3/uL (ref 4.0–10.5)
nRBC: 0 % (ref 0.0–0.2)

## 2023-06-28 LAB — COMPREHENSIVE METABOLIC PANEL
ALT: 36 U/L (ref 0–44)
AST: 64 U/L — ABNORMAL HIGH (ref 15–41)
Albumin: 3.9 g/dL (ref 3.5–5.0)
Alkaline Phosphatase: 74 U/L (ref 38–126)
Anion gap: 14 (ref 5–15)
BUN: 14 mg/dL (ref 6–20)
CO2: 21 mmol/L — ABNORMAL LOW (ref 22–32)
Calcium: 8.6 mg/dL — ABNORMAL LOW (ref 8.9–10.3)
Chloride: 99 mmol/L (ref 98–111)
Creatinine, Ser: 1.08 mg/dL (ref 0.61–1.24)
GFR, Estimated: >60 mL/min (ref >60)
Glucose, Bld: 83 mg/dL (ref 70–99)
Potassium: 3.6 mmol/L (ref 3.5–5.1)
Sodium: 134 mmol/L — ABNORMAL LOW (ref 135–145)
Total Bilirubin: 0.6 mg/dL (ref 0.0–1.2)
Total Protein: 6.8 g/dL (ref 6.5–8.1)

## 2023-06-28 LAB — ETHANOL: Alcohol, Ethyl (B): 31 mg/dL — ABNORMAL HIGH (ref <10)

## 2023-06-28 MED ORDER — LIDOCAINE-EPINEPHRINE (PF) 2 %-1:200000 IJ SOLN
10.0000 mL | Freq: Once | INTRAMUSCULAR | Status: AC
  Administered 2023-06-28: 10 mL via INTRADERMAL
  Filled 2023-06-28: qty 20

## 2023-06-28 NOTE — ED Notes (Signed)
 C-collar applied
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
## 2023-06-28 NOTE — Discharge Instructions (Signed)
 We placed 2 stitches near your left eyebrow.  These will dissolve on their own.  Otherwise keep the areas clean with soap and water.  You can apply ice and take ibuprofen and/or Tylenol to help with pain.  If you develop new or worsening pain or other new/concerning symptoms then return to the ER.

## 2023-06-30 ENCOUNTER — Emergency Department
Admission: EM | Admit: 2023-06-30 | Discharge: 2023-07-01 | Disposition: A | Discharge status: Other Acute Inpatient Hospital | Attending: Emergency Medicine | Admitting: Emergency Medicine

## 2023-06-30 ENCOUNTER — Other Ambulatory Visit: Payer: Self-pay

## 2023-06-30 DIAGNOSIS — F22 Delusional disorders: Secondary | ICD-10-CM | POA: Diagnosis not present

## 2023-06-30 DIAGNOSIS — F209 Schizophrenia, unspecified: Secondary | ICD-10-CM | POA: Diagnosis present

## 2023-06-30 DIAGNOSIS — R456 Violent behavior: Secondary | ICD-10-CM | POA: Insufficient documentation

## 2023-06-30 LAB — COMPREHENSIVE METABOLIC PANEL WITH GFR
ALT: 36 U/L (ref 0–44)
AST: 50 U/L — ABNORMAL HIGH (ref 15–41)
Albumin: 3.8 g/dL (ref 3.5–5.0)
Alkaline Phosphatase: 68 U/L (ref 38–126)
Anion gap: 11 (ref 5–15)
BUN: 9 mg/dL (ref 6–20)
CO2: 24 mmol/L (ref 22–32)
Calcium: 8.8 mg/dL — ABNORMAL LOW (ref 8.9–10.3)
Chloride: 102 mmol/L (ref 98–111)
Creatinine, Ser: 1.07 mg/dL (ref 0.61–1.24)
GFR, Estimated: >60 mL/min (ref >60)
Glucose, Bld: 104 mg/dL — ABNORMAL HIGH (ref 70–99)
Potassium: 3.4 mmol/L — ABNORMAL LOW (ref 3.5–5.1)
Sodium: 137 mmol/L (ref 135–145)
Total Bilirubin: 0.5 mg/dL (ref 0.0–1.2)
Total Protein: 6.8 g/dL (ref 6.5–8.1)

## 2023-06-30 LAB — CBC WITH DIFFERENTIAL/PLATELET
Abs Immature Granulocytes: 0.01 10*3/uL (ref 0.00–0.07)
Basophils Absolute: 0 10*3/uL (ref 0.0–0.1)
Basophils Relative: 0 %
Eosinophils Absolute: 0.1 10*3/uL (ref 0.0–0.5)
Eosinophils Relative: 2 %
HCT: 41.2 % (ref 39.0–52.0)
Hemoglobin: 14.3 g/dL (ref 13.0–17.0)
Immature Granulocytes: 0 %
Lymphocytes Relative: 46 %
Lymphs Abs: 2.2 10*3/uL (ref 0.7–4.0)
MCH: 32 pg (ref 26.0–34.0)
MCHC: 34.7 g/dL (ref 30.0–36.0)
MCV: 92.2 fL (ref 80.0–100.0)
Monocytes Absolute: 0.5 10*3/uL (ref 0.1–1.0)
Monocytes Relative: 10 %
Neutro Abs: 2 10*3/uL (ref 1.7–7.7)
Neutrophils Relative %: 42 %
Platelets: 314 10*3/uL (ref 150–400)
RBC: 4.47 MIL/uL (ref 4.22–5.81)
RDW: 12.9 % (ref 11.5–15.5)
WBC: 4.7 10*3/uL (ref 4.0–10.5)
nRBC: 0 % (ref 0.0–0.2)

## 2023-06-30 LAB — ETHANOL: Alcohol, Ethyl (B): 70 mg/dL — ABNORMAL HIGH (ref <10)

## 2023-06-30 LAB — ACETAMINOPHEN LEVEL: Acetaminophen (Tylenol), Serum: <10 ug/mL — ABNORMAL LOW (ref 10–30)

## 2023-06-30 LAB — SALICYLATE LEVEL: Salicylate Lvl: <7 mg/dL — ABNORMAL LOW (ref 7.0–30.0)

## 2023-06-30 MED ORDER — DROPERIDOL 2.5 MG/ML IJ SOLN
5.0000 mg | Freq: Once | INTRAMUSCULAR | Status: AC
  Administered 2023-06-30: 5 mg via INTRAMUSCULAR
  Filled 2023-06-30: qty 2

## 2023-06-30 MED ORDER — HALOPERIDOL 5 MG PO TABS
5.0000 mg | ORAL_TABLET | Freq: Two times a day (BID) | ORAL | Status: DC
  Administered 2023-06-30 – 2023-07-01 (×2): 5 mg via ORAL
  Filled 2023-06-30 (×3): qty 1

## 2023-06-30 NOTE — ED Notes (Signed)
 Patient immediately became calm and cooperative after this RN administered IM medication. Patient apologetic with staff at this time. Patient stating "I'm sorry. I'm not going to be no trouble". This RN reassured patient that we are doing our best to provide proper care to him, but yelling and being aggressive with staff is not allowed at our facility. Patient continues to apologize and asks for an ice cream. Patient provided a snack and lights were turned off. Patient denies other needs at this time.
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
## 2023-06-30 NOTE — BH Assessment (Signed)
 Per [facility] Tresa Endo S.,), patient to be referred out of system.  Referral information for Psychiatric Hospitalization faxed to;   [facility] [phone]), No appropriate bed.  Service Provider Phone  [facility] [phone]  [facility] [phone]  [facility] [phone]  [facility] [phone]  [facility] [phone]  [facility] [phone]  [facility] [phone]  [facility] [phone]  [facility] [phone]  [place] EFAX [phone]  [facility] [phone]  [facility] [phone]  [facility] [phone]

## 2023-06-30 NOTE — ED Notes (Signed)
pt recieved snack and drink 
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
## 2023-06-30 NOTE — ED Notes (Signed)
 Patient given dinner tray.
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
## 2023-06-30 NOTE — ED Notes (Signed)
 IVC PENDING  CONSULT ?

## 2023-06-30 NOTE — ED Notes (Signed)
 Changed into behavioral scrubs by RN with 2 security at side. Pt cooperative at this time. 2 bags of belongings labeled that include shoes, socks, underwear, pants, shorts, shirt, jacket, beanie.

## 2023-06-30 NOTE — ED Notes (Signed)
 Patient asking to use the phone at this time. Security notified the patient that phone hours have ended so according to policy, he is not able to use the phone. The patient became verbally aggressive at this time. Speaking in loud tones. Yelling "this is bullshit. Ya'll telling me you have phone hours". Patient pacing back and forth in the hallway continuing to scream. MD Jessup notified. See new orders.
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
## 2023-06-30 NOTE — ED Notes (Signed)
 This RN took over assignment of patient. Patient sleeping at this time. Chest rise and fall noted. Even and unlabored RR. Environment secure and safe for the patient. Ongoing monitoring by this RN and safety rounder.

## 2023-07-01 LAB — URINE DRUG SCREEN, QUALITATIVE (ARMC ONLY)
Amphetamines, Ur Screen: NOT DETECTED
Barbiturates, Ur Screen: NOT DETECTED
Benzodiazepine, Ur Scrn: NOT DETECTED
Cannabinoid 50 Ng, Ur ~~LOC~~: NOT DETECTED
Cocaine Metabolite,Ur ~~LOC~~: NOT DETECTED
MDMA (Ecstasy)Ur Screen: NOT DETECTED
Methadone Scn, Ur: NOT DETECTED
Opiate, Ur Screen: NOT DETECTED
Phencyclidine (PCP) Ur S: NOT DETECTED
Tricyclic, Ur Screen: NOT DETECTED

## 2023-07-01 LAB — RESP PANEL BY RT-PCR (RSV, FLU A&B, COVID)  RVPGX2
Influenza A by PCR: NEGATIVE
Influenza B by PCR: NEGATIVE
Resp Syncytial Virus by PCR: NEGATIVE
SARS Coronavirus 2 by RT PCR: NEGATIVE

## 2023-07-01 MED ORDER — HALOPERIDOL 5 MG PO TABS: 10.0000 mg | ORAL_TABLET | Freq: Two times a day (BID) | ORAL | Status: DC

## 2023-07-01 MED ORDER — IBUPROFEN 800 MG PO TABS
800.0000 mg | ORAL_TABLET | Freq: Four times a day (QID) | ORAL | Status: DC | PRN
  Administered 2023-07-01 (×2): 800 mg via ORAL
  Filled 2023-07-01 (×2): qty 1

## 2023-07-01 MED ORDER — RISPERIDONE 1 MG PO TABS
0.5000 mg | ORAL_TABLET | Freq: Once | ORAL | Status: AC
  Administered 2023-07-01: 0.5 mg via ORAL
  Filled 2023-07-01: qty 1

## 2023-07-01 MED ORDER — ACETAMINOPHEN 500 MG PO TABS
1000.0000 mg | ORAL_TABLET | Freq: Four times a day (QID) | ORAL | Status: DC | PRN
  Administered 2023-07-01: 1000 mg via ORAL
  Filled 2023-07-01: qty 2

## 2023-07-01 MED ORDER — TRAZODONE HCL 100 MG PO TABS: 100.0000 mg | ORAL_TABLET | Freq: Every day | ORAL | Status: DC

## 2023-07-01 NOTE — ED Notes (Addendum)
 EDP notified about Pts c/o of Pain. See new orders.

## 2023-07-01 NOTE — ED Notes (Signed)
 Pt declines vitals at this time, will get vitals when Pt wakes up.

## 2023-07-01 NOTE — ED Notes (Signed)
 Pt accidentally spilled water in his room, this RN cleaned it up to prevent falls from occurring. Pt given rag to wash face and thrown into soiled linen.

## 2023-07-01 NOTE — ED Notes (Signed)
 UDS/COVID results faxed to [facility] to ATTN: Britta Mccreedy by [employer]omatic Services operational officer.

## 2023-07-01 NOTE — ED Notes (Signed)
 Pt declines UDS sample x3, this RN asked Pt to attempt and he states he cannot go yet. Pending UDS.

## 2023-07-01 NOTE — BH Assessment (Addendum)
 Patient has been accepted to [facility].  Patient assigned to room TBD Accepting physician is Johney Maine, NP.  Call report to [phone] (this number is routing to a [facility] Disposition SW line) / [phone]. Mannie Stabile ([place]): [phone] option #5 - to [facility] unit Representative was [place].   ER Staff is aware of it: Ronnie, ER Secretary  Dr. Roxan Hockey, ER MD  Merrie Roof, Patient's Nurse       Address: [address] [place], [place] [address]

## 2023-07-01 NOTE — ED Notes (Signed)
 Pt and girlfriend want an update from TTS, I gave number but made no promises.

## 2023-07-01 NOTE — ED Notes (Signed)
 PT. IVC/RECOMMENDED FOR INPATIENT PSYCH WHEN MEDICALLY CLEARED.

## 2023-07-01 NOTE — ED Notes (Signed)
 Pt given breakfast tray

## 2023-07-01 NOTE — ED Notes (Signed)
 Patient complaining of neck pain at this time. MD Ward notified.

## 2023-07-01 NOTE — ED Notes (Addendum)
 Britta Mccreedy from [facility] called for Pt Placement information. They're requesting Pt start on Respiridol BID at starting dose before we bring him over to their facility. They're also requesting COVID test and results of UDS, Barbara at intake stated that they are ready to receive Pt whenever we're ready to deliver him. Number to call nurse for report is [phone]. Accepting Provider is Johney Maine, NP. TTS notified.

## 2023-07-01 NOTE — ED Notes (Signed)
 Pt given lunch tray.

## 2023-07-01 NOTE — BH Assessment (Signed)
 TTS contacted pt's significant other and provided updates regarding accepting facility. TTS provided name, address, and phone number of accepting facility. Pt's significant other also had questions regarding patient's medications ... TTS explained that I was unable to discuss medications as this is not within my scope of practice. She expressed understanding. TTS explained to pt's SO that she can speak to patient's assigned provider regarding prescribed medication regimens.

## 2023-07-01 NOTE — ED Provider Notes (Signed)
 Emergency Medicine Observation Re-evaluation Note  Antonio Mccoy is a 48 y.o. male, seen on rounds today.  Pt initially presented to the ED for complaints of Aggressive Behavior  Currently, the patient is is no acute distress. Denies any concerns at this time.  Physical Exam  Blood pressure 129/67, pulse 89, temperature 98.5 F (36.9 C), temperature source Oral, resp. rate 18, height 5\' 10"  (1.778 m), weight 86 kg, SpO2 94%.  Physical Exam: General: No apparent distress Pulm: Normal WOB Neuro: Moving all extremities Psych: Resting comfortably     ED Course / MDM     I have reviewed the labs performed to date as well as medications administered while in observation.  Recent changes in the last 24 hours include: No acute events overnight.  Plan   Current plan: Patient awaiting psychiatric disposition. Patient is under full IVC at this time.    Antonio Mccoy, Layla Maw, DO 07/01/23 [phone]

## 2023-07-01 NOTE — ED Notes (Signed)
 Pt shouting and making noises, repeats "I don't want nothing at all." Pt took his C-collar off.

## 2023-07-01 NOTE — ED Notes (Signed)
 Pt given ginger ale.

## 2023-11-14 ENCOUNTER — Encounter (HOSPITAL_COMMUNITY): Payer: Self-pay | Admitting: Emergency Medicine

## 2023-11-14 ENCOUNTER — Other Ambulatory Visit: Payer: Self-pay

## 2023-11-14 ENCOUNTER — Emergency Department (HOSPITAL_COMMUNITY)

## 2023-11-14 ENCOUNTER — Emergency Department (HOSPITAL_COMMUNITY)
Admission: EM | Admit: 2023-11-14 | Discharge: 2023-11-14 | Disposition: A | Discharge status: Home / Self Care | Attending: Emergency Medicine | Admitting: Emergency Medicine

## 2023-11-14 DIAGNOSIS — R55 Syncope and collapse: Secondary | ICD-10-CM | POA: Insufficient documentation

## 2023-11-14 DIAGNOSIS — F17210 Nicotine dependence, cigarettes, uncomplicated: Secondary | ICD-10-CM | POA: Insufficient documentation

## 2023-11-14 LAB — URINALYSIS, ROUTINE W REFLEX MICROSCOPIC
Bacteria, UA: NONE SEEN
Bilirubin Urine: NEGATIVE
Glucose, UA: 50 mg/dL — AB
Ketones, ur: NEGATIVE mg/dL
Leukocytes,Ua: NEGATIVE
Nitrite: NEGATIVE
Protein, ur: NEGATIVE mg/dL
Specific Gravity, Urine: 1.019 (ref 1.005–1.030)
pH: 5 (ref 5.0–8.0)

## 2023-11-14 LAB — CBC
HCT: 48.5 % (ref 39.0–52.0)
Hemoglobin: 16.2 g/dL (ref 13.0–17.0)
MCH: 31 pg (ref 26.0–34.0)
MCHC: 33.4 g/dL (ref 30.0–36.0)
MCV: 92.7 fL (ref 80.0–100.0)
Platelets: 245 K/uL (ref 150–400)
RBC: 5.23 MIL/uL (ref 4.22–5.81)
RDW: 13.4 % (ref 11.5–15.5)
WBC: 8 K/uL (ref 4.0–10.5)
nRBC: 0 % (ref 0.0–0.2)

## 2023-11-14 LAB — COMPREHENSIVE METABOLIC PANEL WITH GFR
ALT: 16 U/L (ref 0–44)
AST: 21 U/L (ref 15–41)
Albumin: 3.8 g/dL (ref 3.5–5.0)
Alkaline Phosphatase: 80 U/L (ref 38–126)
Anion gap: 10 (ref 5–15)
BUN: 10 mg/dL (ref 6–20)
CO2: 24 mmol/L (ref 22–32)
Calcium: 9.3 mg/dL (ref 8.9–10.3)
Chloride: 103 mmol/L (ref 98–111)
Creatinine, Ser: 1.11 mg/dL (ref 0.61–1.24)
GFR, Estimated: >60 mL/min (ref >60)
Glucose, Bld: 144 mg/dL — ABNORMAL HIGH (ref 70–99)
Potassium: 3.8 mmol/L (ref 3.5–5.1)
Sodium: 137 mmol/L (ref 135–145)
Total Bilirubin: 0.7 mg/dL (ref 0.0–1.2)
Total Protein: 7.6 g/dL (ref 6.5–8.1)
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
## 2023-11-14 NOTE — ED Triage Notes (Signed)
 Patient presents due to syncopal episode last. He landed on the floor ina a half split. He thinks he may have hit his head on the wall, but is unsure. He complains of a headache and right leg pain/ swelling. He denies visual changes.

## 2023-11-14 NOTE — Discharge Instructions (Addendum)
 While you were in the emergency room, had blood work done that was normal.  Your EKG was also normal.  Like we discussed, sometimes people can have these fainting episodes particularly going to the bathroom.  Make sure that you are getting plenty water  to drink, getting plenty to eat.  Plenty of rest.  If you begin to feel faint, sit down and wait for the symptoms to pass.  Follow-up with your primary care doctor within 1 to 2 weeks.  For your knee, you can use crutches as needed.  You can take Motrin  and Tylenol .  You may use the knee immobilizer when you are awake.  You can take it off to shower, bathe and sleep.  You can call orthopedic surgery, Dr. Barton, for follow-up appointment.

## 2023-11-14 NOTE — ED Notes (Signed)
 Knee brace applied and patient educated on how to use crutches.

## 2023-11-14 NOTE — ED Provider Triage Note (Signed)
 Emergency Medicine Provider Triage Evaluation Note  JOACHIM CARTON , a 48 y.o. male  was evaluated in triage.  Pt complains of syncopal episode Hx of similar.  Review of Systems  Positive: R knee pain Negative: CP, SHOB, N/V/D, had slight HA but resolved, diplopia, tinnitius  Physical Exam  BP 136/88 (BP Location: Right Arm)   Pulse 93   Temp 98.7 F (37.1 C) (Axillary) Comment (Src): unable to obtain oral at this time  Resp 16   SpO2 97%  Gen:   Awake, no distress   Resp:  Normal effort  MSK:   Moves extremities without difficulty  Other:    Medical Decision Making  Medically screening exam initiated at 4:12 PM.  Appropriate orders placed.  Nikita L Labrie was informed that the remainder of the evaluation will be completed by another provider, this initial triage assessment does not replace that evaluation, and the importance of remaining in the ED until their evaluation is complete.  Labs and imaging ordered   Francis Ileana LOISE DEVONNA 11/14/23 [id number]
